# Patient Record
Sex: Female | Born: 1935 | Race: White | Hispanic: No | Smoking: Former smoker
Health system: Southern US, Community
[De-identification: ages and names within clinical notes are randomized; demographics above are authoritative.]

## PROBLEM LIST (undated history)

## (undated) DIAGNOSIS — F0280 Dementia in other diseases classified elsewhere without behavioral disturbance: Secondary | ICD-10-CM

## (undated) DIAGNOSIS — G309 Alzheimer's disease, unspecified: Secondary | ICD-10-CM

## (undated) DIAGNOSIS — I10 Essential (primary) hypertension: Secondary | ICD-10-CM

## (undated) DIAGNOSIS — F0390 Unspecified dementia without behavioral disturbance: Secondary | ICD-10-CM

## (undated) DIAGNOSIS — E785 Hyperlipidemia, unspecified: Secondary | ICD-10-CM

## (undated) DIAGNOSIS — C189 Malignant neoplasm of colon, unspecified: Secondary | ICD-10-CM

## (undated) DIAGNOSIS — E119 Type 2 diabetes mellitus without complications: Secondary | ICD-10-CM

## (undated) HISTORY — DX: Essential (primary) hypertension: I10

## (undated) HISTORY — DX: Hyperlipidemia, unspecified: E78.5

## (undated) HISTORY — PX: CHOLECYSTECTOMY: SHX55

## (undated) HISTORY — DX: Type 2 diabetes mellitus without complications: E11.9

---

## 1999-10-08 ENCOUNTER — Ambulatory Visit (HOSPITAL_COMMUNITY): Admission: RE | Admit: 1999-10-08 | Discharge: 1999-10-08 | Payer: Self-pay | Admitting: Internal Medicine

## 1999-10-09 ENCOUNTER — Encounter: Payer: Self-pay | Admitting: Internal Medicine

## 2003-12-03 ENCOUNTER — Other Ambulatory Visit: Admission: RE | Admit: 2003-12-03 | Discharge: 2003-12-03 | Payer: Self-pay | Admitting: Internal Medicine

## 2006-12-21 ENCOUNTER — Other Ambulatory Visit: Admission: RE | Admit: 2006-12-21 | Discharge: 2006-12-21 | Payer: Self-pay | Admitting: Internal Medicine

## 2007-04-14 ENCOUNTER — Encounter: Admission: RE | Admit: 2007-04-14 | Discharge: 2007-04-14 | Payer: Self-pay | Admitting: Internal Medicine

## 2007-12-28 ENCOUNTER — Encounter: Admission: RE | Admit: 2007-12-28 | Discharge: 2007-12-28 | Payer: Self-pay | Admitting: Internal Medicine

## 2008-12-31 ENCOUNTER — Ambulatory Visit: Payer: Self-pay | Admitting: Gastroenterology

## 2009-01-14 ENCOUNTER — Ambulatory Visit: Payer: Self-pay | Admitting: Gastroenterology

## 2009-01-14 ENCOUNTER — Encounter: Payer: Self-pay | Admitting: Gastroenterology

## 2009-01-17 ENCOUNTER — Encounter: Payer: Self-pay | Admitting: Gastroenterology

## 2013-11-10 ENCOUNTER — Encounter: Payer: Self-pay | Admitting: Gastroenterology

## 2014-06-13 ENCOUNTER — Encounter: Payer: Self-pay | Admitting: Gastroenterology

## 2015-01-01 ENCOUNTER — Ambulatory Visit (INDEPENDENT_AMBULATORY_CARE_PROVIDER_SITE_OTHER): Payer: BLUE CROSS/BLUE SHIELD | Admitting: Podiatry

## 2015-01-01 ENCOUNTER — Encounter: Payer: Self-pay | Admitting: Podiatry

## 2015-01-01 VITALS — BP 177/75 | HR 57 | Resp 12 | Ht 60.0 in | Wt 150.0 lb

## 2015-01-01 DIAGNOSIS — E119 Type 2 diabetes mellitus without complications: Secondary | ICD-10-CM

## 2015-01-01 DIAGNOSIS — M79676 Pain in unspecified toe(s): Secondary | ICD-10-CM

## 2015-01-01 DIAGNOSIS — B351 Tinea unguium: Secondary | ICD-10-CM

## 2015-01-01 NOTE — Patient Instructions (Signed)
Diabetes and Foot Care Diabetes may cause you to have problems because of poor blood supply (circulation) to your feet and legs. This may cause the skin on your feet to become thinner, break easier, and heal more slowly. Your skin may become dry, and the skin may peel and crack. You may also have nerve damage in your legs and feet causing decreased feeling in them. You may not notice minor injuries to your feet that could lead to infections or more serious problems. Taking care of your feet is one of the most important things you can do for yourself.  HOME CARE INSTRUCTIONS  Wear shoes at all times, even in the house. Do not go barefoot. Bare feet are easily injured.  Check your feet daily for blisters, cuts, and redness. If you cannot see the bottom of your feet, use a mirror or ask someone for help.  Wash your feet with warm water (do not use hot water) and mild soap. Then pat your feet and the areas between your toes until they are completely dry. Do not soak your feet as this can dry your skin.  Apply a moisturizing lotion or petroleum jelly (that does not contain alcohol and is unscented) to the skin on your feet and to dry, brittle toenails. Do not apply lotion between your toes.  Trim your toenails straight across. Do not dig under them or around the cuticle. File the edges of your nails with an emery board or nail file.  Do not cut corns or calluses or try to remove them with medicine.  Wear clean socks or stockings every day. Make sure they are not too tight. Do not wear knee-high stockings since they may decrease blood flow to your legs.  Wear shoes that fit properly and have enough cushioning. To break in new shoes, wear them for just a few hours a day. This prevents you from injuring your feet. Always look in your shoes before you put them on to be sure there are no objects inside.  Do not cross your legs. This may decrease the blood flow to your feet.  If you find a minor scrape,  cut, or break in the skin on your feet, keep it and the skin around it clean and dry. These areas may be cleansed with mild soap and water. Do not cleanse the area with peroxide, alcohol, or iodine.  When you remove an adhesive bandage, be sure not to damage the skin around it.  If you have a wound, look at it several times a day to make sure it is healing.  Do not use heating pads or hot water bottles. They may burn your skin. If you have lost feeling in your feet or legs, you may not know it is happening until it is too late.  Make sure your health care provider performs a complete foot exam at least annually or more often if you have foot problems. Report any cuts, sores, or bruises to your health care provider immediately. SEEK MEDICAL CARE IF:   You have an injury that is not healing.  You have cuts or breaks in the skin.  You have an ingrown nail.  You notice redness on your legs or feet.  You feel burning or tingling in your legs or feet.  You have pain or cramps in your legs and feet.  Your legs or feet are numb.  Your feet always feel cold. SEEK IMMEDIATE MEDICAL CARE IF:   There is increasing redness,   swelling, or pain in or around a wound.  There is a red line that goes up your leg.  Pus is coming from a wound.  You develop a fever or as directed by your health care provider.  You notice a bad smell coming from an ulcer or wound. Document Released: 11/12/2000 Document Revised: 07/18/2013 Document Reviewed: 04/24/2013 [employer] Patient Information 2015 [employer]. This information is not intended to replace advice given to you by your health care provider. Make sure you discuss any questions you have with your health care provider.  

## 2015-01-01 NOTE — Progress Notes (Signed)
   Subjective:    Patient ID: Yolanda Kennedy, female    DOB: Jul 27, 1928, 81 y.o.   MRN: [id number]  HPI Comments: N diabetic foot exam and debridement L 10 toenails D and O long-term C elongated thickened toenails A diabetic pt, difficulty cutting T hx of salon pedicure  She denies any history of foot ulceration or claudication   Review of Systems  Musculoskeletal: Positive for myalgias.  All other systems reviewed and are negative.      Objective:   Physical Exam   Orientated 3 patient presents with friend of the family  Vascular: DP pulses 2/4 bilaterally PT pulses 2/4 bilaterally Capillary reflex equal and reactive bilaterally  Neurological: Ankle reflex equal and reactive bilaterally Vibratory sensation intact bilaterally Sensation to 10 g monofilament wire intact 4/5 bilaterally  Dermatological: The toenails are extremely elongated, incurvated, discolored, hypertrophic and tender to palpation 6-10  Musculoskeletal HAV deformities bilaterally Hammertoe deformity second right Pes planus bilaterally There is no restriction ankle, subtalar, midtarsal joints bilaterally       Assessment & Plan:   Assessment: Satisfactory neurovascular status Diabetic without any complications Symptomatic onychomycoses 6-10  Plan: Debridement toenails 10 without a bleeding Discussed patient's findings of the diabetic foot examination today  Reappoint at three-month intervals for nail debridement

## 2015-01-26 ENCOUNTER — Emergency Department (HOSPITAL_COMMUNITY): Admission: EM | Admit: 2015-01-26 | Discharge: 2015-01-26 | Discharge status: Absent without leave | Payer: Self-pay

## 2015-01-26 ENCOUNTER — Emergency Department (HOSPITAL_COMMUNITY)
Admission: EM | Admit: 2015-01-26 | Discharge: 2015-01-26 | Disposition: A | Discharge status: Home / Self Care | Payer: Medicare Other | Attending: Emergency Medicine | Admitting: Emergency Medicine

## 2015-01-26 ENCOUNTER — Encounter (HOSPITAL_COMMUNITY): Payer: Self-pay | Admitting: Emergency Medicine

## 2015-01-26 DIAGNOSIS — M79609 Pain in unspecified limb: Secondary | ICD-10-CM | POA: Diagnosis not present

## 2015-01-26 DIAGNOSIS — I10 Essential (primary) hypertension: Secondary | ICD-10-CM | POA: Insufficient documentation

## 2015-01-26 DIAGNOSIS — E785 Hyperlipidemia, unspecified: Secondary | ICD-10-CM | POA: Diagnosis not present

## 2015-01-26 DIAGNOSIS — Z87891 Personal history of nicotine dependence: Secondary | ICD-10-CM | POA: Diagnosis not present

## 2015-01-26 DIAGNOSIS — M25562 Pain in left knee: Secondary | ICD-10-CM

## 2015-01-26 DIAGNOSIS — E119 Type 2 diabetes mellitus without complications: Secondary | ICD-10-CM | POA: Diagnosis not present

## 2015-01-26 DIAGNOSIS — Z79899 Other long term (current) drug therapy: Secondary | ICD-10-CM | POA: Diagnosis not present

## 2015-01-26 DIAGNOSIS — M79605 Pain in left leg: Secondary | ICD-10-CM | POA: Diagnosis present

## 2015-01-26 LAB — BASIC METABOLIC PANEL
ANION GAP: 9 (ref 5–15)
BUN: 24 mg/dL — ABNORMAL HIGH (ref 6–23)
CALCIUM: 9.2 mg/dL (ref 8.4–10.5)
CO2: 27 mmol/L (ref 19–32)
CREATININE: 1.18 mg/dL — AB (ref 0.50–1.10)
Chloride: 99 mmol/L (ref 96–112)
GFR calc non Af Amer: 41 mL/min — ABNORMAL LOW (ref >90)
GFR, EST AFRICAN AMERICAN: 48 mL/min — AB (ref >90)
Glucose, Bld: 149 mg/dL — ABNORMAL HIGH (ref 70–99)
POTASSIUM: 3.6 mmol/L (ref 3.5–5.1)
Sodium: 135 mmol/L (ref 135–145)

## 2015-01-26 LAB — CBC
HCT: 43.9 % (ref 36.0–46.0)
HEMOGLOBIN: 15.4 g/dL — AB (ref 12.0–15.0)
MCH: 31.8 pg (ref 26.0–34.0)
MCHC: 35.1 g/dL (ref 30.0–36.0)
MCV: 90.5 fL (ref 78.0–100.0)
Platelets: 150 10*3/uL (ref 150–400)
RBC: 4.85 MIL/uL (ref 3.87–5.11)
RDW: 13.3 % (ref 11.5–15.5)
WBC: 5 10*3/uL (ref 4.0–10.5)

## 2015-01-26 NOTE — ED Notes (Addendum)
Pt c/o pain to back of left leg onset Friday. Family concerned with possible DVT. Pt has history of baker's cyst. During triage pt c/o pain to left elbow area, hard knot noted to left elbow. Pt denies recent injury.

## 2015-01-26 NOTE — ED Provider Notes (Signed)
CSN: [id number]     Arrival date & time 01/26/15  1134 History   First MD Initiated Contact with Patient 01/26/15 1325     Chief Complaint  Patient presents with  . Leg Pain     (Consider location/radiation/quality/duration/timing/severity/associated sxs/prior Treatment) Patient is a 81 y.o. female presenting with leg pain. The history is provided by the patient and a relative.  Leg Pain Location:  Knee Injury: no   Knee location:  L knee Pain details:    Quality:  Aching and throbbing   Radiates to:  Does not radiate   Severity:  Moderate   Onset quality:  Gradual   Duration:  3 days   Timing:  Intermittent   Progression:  Waxing and waning Chronicity:  New Prior injury to area:  No (prior bakers cyst) Relieved by:  Rest Worsened by:  Bearing weight Ineffective treatments:  None tried Associated symptoms: no decreased ROM, no fever, no muscle weakness and no swelling   Risk factors: no frequent fractures     Past Medical History  Diagnosis Date  . Hyperlipidemia   . Hypertension   . Diabetes mellitus without complication    Past Surgical History  Procedure Laterality Date  . Cholecystectomy     No family history on file. History  Substance Use Topics  . Smoking status: Former Research scientist (life sciences)  . Smokeless tobacco: Not on file  . Alcohol Use: No   OB History    No data available     Review of Systems  Constitutional: Negative for fever.  All other systems reviewed and are negative.     Allergies  Review of patient's allergies indicates no known allergies.  Home Medications   Prior to Admission medications   Medication Sig Start Date End Date Taking? Authorizing Provider  atorvastatin (LIPITOR) 10 MG tablet Take 10 mg by mouth daily.    Historical Provider, MD  glimepiride (AMARYL) 4 MG tablet Take 4 mg by mouth daily with breakfast.    Historical Provider, MD  hydrochlorothiazide (HYDRODIURIL) 25 MG tablet Take 25 mg by mouth daily.    Historical Provider,  MD  lisinopril (PRINIVIL,ZESTRIL) 40 MG tablet Take 40 mg by mouth daily.    Historical Provider, MD  metFORMIN (GLUCOPHAGE) 500 MG tablet Take by mouth 2 (two) times daily with a meal.    Historical Provider, MD  metoprolol tartrate (LOPRESSOR) 25 MG tablet Take 25 mg by mouth daily.    Historical Provider, MD   BP 138/64 mmHg  Pulse 68  Temp(Src) 97.1 F (36.2 C)  Resp 18  Ht 5' (1.524 m)  Wt 152 lb 4 oz (69.06 kg)  BMI 29.73 kg/m2  SpO2 96% Physical Exam  Constitutional: She is oriented to person, place, and time. She appears well-developed and well-nourished. No distress.  HENT:  Head: Normocephalic and atraumatic.  Eyes: EOM are normal. Pupils are equal, round, and reactive to light.  Cardiovascular: Normal rate.   Pulmonary/Chest: Effort normal.  Musculoskeletal:       Left elbow: Normal.       Left knee: She exhibits normal range of motion, no swelling, no effusion, no deformity and no bony tenderness.       Legs: Neurological: She is alert and oriented to person, place, and time.  Skin: Skin is warm and dry. No rash noted. No erythema.  Psychiatric: She has a normal mood and affect. Her behavior is normal.  Nursing note and vitals reviewed.   ED Course  Procedures (including critical  care time) Labs Review Labs Reviewed  CBC - Abnormal; Notable for the following:    Hemoglobin 15.4 (*)    All other components within normal limits  BASIC METABOLIC PANEL - Abnormal; Notable for the following:    Glucose, Bld 149 (*)    BUN 24 (*)    Creatinine, Ser 1.18 (*)    GFR calc non Af Amer 41 (*)    GFR calc Af Amer 48 (*)    All other components within normal limits    Imaging Review No results found.   EKG Interpretation None      MDM   Final diagnoses:  Knee pain, acute, left    Patient presenting with worsening pain behind her left knee in the popliteal fossa with a prior Baker's cyst in the past. Worse with walking improved with rest denies lower  extremity erythema, swelling. On exam mild tenderness and fullness in the popliteal fossa on the left but no other acute findings. On my exam patient has no elbow injury or visible swelling.  Patient had labs done in triage which are within normal limits. Duplex of the left lower extremity pending.  3:15 PM  Doppler neg for clot or cyst.  Most likely pain is from arthritis.  Pt to use tylenol and ibuprofen.  Blanchie Dessert, MD 01/26/15 1517

## 2015-01-26 NOTE — ED Notes (Signed)
Pt with left posterior lateral knee pain. No visible swelling . Denies injury or abnormal activity.

## 2015-01-26 NOTE — Progress Notes (Signed)
VASCULAR LAB PRELIMINARY  PRELIMINARY  PRELIMINARY  PRELIMINARY  Left lower extremity venous Doppler completed.    Preliminary report:  There is no DVT, SVT, or Baker's cyst noted in the left lower extremity.   Shanikia Kernodle, RVT 01/26/2015, 2:30 PM

## 2015-03-17 ENCOUNTER — Ambulatory Visit (INDEPENDENT_AMBULATORY_CARE_PROVIDER_SITE_OTHER): Payer: Medicare Other | Admitting: Podiatry

## 2015-03-17 ENCOUNTER — Encounter: Payer: Self-pay | Admitting: Podiatry

## 2015-03-17 DIAGNOSIS — M79676 Pain in unspecified toe(s): Secondary | ICD-10-CM

## 2015-03-17 DIAGNOSIS — B351 Tinea unguium: Secondary | ICD-10-CM | POA: Diagnosis not present

## 2015-03-17 NOTE — Patient Instructions (Signed)
Diabetes and Foot Care Diabetes may cause you to have problems because of poor blood supply (circulation) to your feet and legs. This may cause the skin on your feet to become thinner, break easier, and heal more slowly. Your skin may become dry, and the skin may peel and crack. You may also have nerve damage in your legs and feet causing decreased feeling in them. You may not notice minor injuries to your feet that could lead to infections or more serious problems. Taking care of your feet is one of the most important things you can do for yourself.  HOME CARE INSTRUCTIONS  Wear shoes at all times, even in the house. Do not go barefoot. Bare feet are easily injured.  Check your feet daily for blisters, cuts, and redness. If you cannot see the bottom of your feet, use a mirror or ask someone for help.  Wash your feet with warm water (do not use hot water) and mild soap. Then pat your feet and the areas between your toes until they are completely dry. Do not soak your feet as this can dry your skin.  Apply a moisturizing lotion or petroleum jelly (that does not contain alcohol and is unscented) to the skin on your feet and to dry, brittle toenails. Do not apply lotion between your toes.  Trim your toenails straight across. Do not dig under them or around the cuticle. File the edges of your nails with an emery board or nail file.  Do not cut corns or calluses or try to remove them with medicine.  Wear clean socks or stockings every day. Make sure they are not too tight. Do not wear knee-high stockings since they may decrease blood flow to your legs.  Wear shoes that fit properly and have enough cushioning. To break in new shoes, wear them for just a few hours a day. This prevents you from injuring your feet. Always look in your shoes before you put them on to be sure there are no objects inside.  Do not cross your legs. This may decrease the blood flow to your feet.  If you find a minor scrape,  cut, or break in the skin on your feet, keep it and the skin around it clean and dry. These areas may be cleansed with mild soap and water. Do not cleanse the area with peroxide, alcohol, or iodine.  When you remove an adhesive bandage, be sure not to damage the skin around it.  If you have a wound, look at it several times a day to make sure it is healing.  Do not use heating pads or hot water bottles. They may burn your skin. If you have lost feeling in your feet or legs, you may not know it is happening until it is too late.  Make sure your health care provider performs a complete foot exam at least annually or more often if you have foot problems. Report any cuts, sores, or bruises to your health care provider immediately. SEEK MEDICAL CARE IF:   You have an injury that is not healing.  You have cuts or breaks in the skin.  You have an ingrown nail.  You notice redness on your legs or feet.  You feel burning or tingling in your legs or feet.  You have pain or cramps in your legs and feet.  Your legs or feet are numb.  Your feet always feel cold. SEEK IMMEDIATE MEDICAL CARE IF:   There is increasing redness,   swelling, or pain in or around a wound.  There is a red line that goes up your leg.  Pus is coming from a wound.  You develop a fever or as directed by your health care provider.  You notice a bad smell coming from an ulcer or wound. Document Released: 11/12/2000 Document Revised: 07/18/2013 Document Reviewed: 04/24/2013 [employer] Patient Information 2015 [employer]. This information is not intended to replace advice given to you by your health care provider. Make sure you discuss any questions you have with your health care provider.  

## 2015-03-18 NOTE — Progress Notes (Signed)
Patient ID: Yolanda Kennedy, female   DOB: [birth year], 81 y.o.   MRN: [id number]  Subjective: This patient presents again today complaining of painful toenails and requests toenail debridement  Objective: The toenails are elongated, brittle, discolored, incurvated and tender to direct palpation 6-10  Assessment: Diabetic Symptomatic onychomycoses 6-10  Plan: Debridement of toenails 10 without any bleeding  Reappoint 3 month

## 2015-04-02 ENCOUNTER — Ambulatory Visit: Payer: BLUE CROSS/BLUE SHIELD | Admitting: Podiatry

## 2015-05-26 ENCOUNTER — Ambulatory Visit: Payer: Medicare Other | Admitting: Podiatry

## 2015-06-03 ENCOUNTER — Encounter: Payer: Self-pay | Admitting: Podiatry

## 2015-06-03 ENCOUNTER — Ambulatory Visit (INDEPENDENT_AMBULATORY_CARE_PROVIDER_SITE_OTHER): Payer: Medicare Other | Admitting: Podiatry

## 2015-06-03 DIAGNOSIS — M79676 Pain in unspecified toe(s): Secondary | ICD-10-CM

## 2015-06-03 DIAGNOSIS — B351 Tinea unguium: Secondary | ICD-10-CM

## 2015-06-03 NOTE — Progress Notes (Signed)
Patient ID: Yolanda Kennedy, female   DOB: 1928-01-18, 81 y.o.   MRN: [id number]  Subjective: This patient presents for scheduled visit complaining of painful toenails and requests toenail debridement  Objective: The toenails are brittle, elongated, discolored, incurvated and tender to direct palpation 6-10  Assessment: Symptomatic onychomycoses 6-10 Diabetic   Plan: Debridement of toenails 10 mechanically electrically without any bleeding  Reappoint 3 months

## 2015-06-03 NOTE — Patient Instructions (Signed)
Diabetes and Foot Care Diabetes may cause you to have problems because of poor blood supply (circulation) to your feet and legs. This may cause the skin on your feet to become thinner, break easier, and heal more slowly. Your skin may become dry, and the skin may peel and crack. You may also have nerve damage in your legs and feet causing decreased feeling in them. You may not notice minor injuries to your feet that could lead to infections or more serious problems. Taking care of your feet is one of the most important things you can do for yourself.  HOME CARE INSTRUCTIONS  Wear shoes at all times, even in the house. Do not go barefoot. Bare feet are easily injured.  Check your feet daily for blisters, cuts, and redness. If you cannot see the bottom of your feet, use a mirror or ask someone for help.  Wash your feet with warm water (do not use hot water) and mild soap. Then pat your feet and the areas between your toes until they are completely dry. Do not soak your feet as this can dry your skin.  Apply a moisturizing lotion or petroleum jelly (that does not contain alcohol and is unscented) to the skin on your feet and to dry, brittle toenails. Do not apply lotion between your toes.  Trim your toenails straight across. Do not dig under them or around the cuticle. File the edges of your nails with an emery board or nail file.  Do not cut corns or calluses or try to remove them with medicine.  Wear clean socks or stockings every day. Make sure they are not too tight. Do not wear knee-high stockings since they may decrease blood flow to your legs.  Wear shoes that fit properly and have enough cushioning. To break in new shoes, wear them for just a few hours a day. This prevents you from injuring your feet. Always look in your shoes before you put them on to be sure there are no objects inside.  Do not cross your legs. This may decrease the blood flow to your feet.  If you find a minor scrape,  cut, or break in the skin on your feet, keep it and the skin around it clean and dry. These areas may be cleansed with mild soap and water. Do not cleanse the area with peroxide, alcohol, or iodine.  When you remove an adhesive bandage, be sure not to damage the skin around it.  If you have a wound, look at it several times a day to make sure it is healing.  Do not use heating pads or hot water bottles. They may burn your skin. If you have lost feeling in your feet or legs, you may not know it is happening until it is too late.  Make sure your health care provider performs a complete foot exam at least annually or more often if you have foot problems. Report any cuts, sores, or bruises to your health care provider immediately. SEEK MEDICAL CARE IF:   You have an injury that is not healing.  You have cuts or breaks in the skin.  You have an ingrown nail.  You notice redness on your legs or feet.  You feel burning or tingling in your legs or feet.  You have pain or cramps in your legs and feet.  Your legs or feet are numb.  Your feet always feel cold. SEEK IMMEDIATE MEDICAL CARE IF:   There is increasing redness,   swelling, or pain in or around a wound.  There is a red line that goes up your leg.  Pus is coming from a wound.  You develop a fever or as directed by your health care provider.  You notice a bad smell coming from an ulcer or wound. Document Released: 11/12/2000 Document Revised: 07/18/2013 Document Reviewed: 04/24/2013 [employer] Patient Information 2015 [employer]. This information is not intended to replace advice given to you by your health care provider. Make sure you discuss any questions you have with your health care provider.  

## 2015-09-02 ENCOUNTER — Ambulatory Visit: Payer: Medicare Other | Admitting: Podiatry

## 2015-09-03 ENCOUNTER — Encounter: Payer: Self-pay | Admitting: Podiatry

## 2015-09-03 ENCOUNTER — Ambulatory Visit (INDEPENDENT_AMBULATORY_CARE_PROVIDER_SITE_OTHER): Payer: Medicare Other | Admitting: Podiatry

## 2015-09-03 DIAGNOSIS — B351 Tinea unguium: Secondary | ICD-10-CM

## 2015-09-03 DIAGNOSIS — M79676 Pain in unspecified toe(s): Secondary | ICD-10-CM

## 2015-09-03 NOTE — Patient Instructions (Signed)
Apply topical antibiotic ointment daily between first and second toes on the right foot and first and second toes on the left foot and second and third toes on the right foot until the skin appears normal These areas or fissuresDiabetes and Foot Care Diabetes may cause you to have problems because of poor blood supply (circulation) to your feet and legs. This may cause the skin on your feet to become thinner, break easier, and heal more slowly. Your skin may become dry, and the skin may peel and crack. You may also have nerve damage in your legs and feet causing decreased feeling in them. You may not notice minor injuries to your feet that could lead to infections or more serious problems. Taking care of your feet is one of the most important things you can do for yourself.  HOME CARE INSTRUCTIONS  Wear shoes at all times, even in the house. Do not go barefoot. Bare feet are easily injured.  Check your feet daily for blisters, cuts, and redness. If you cannot see the bottom of your feet, use a mirror or ask someone for help.  Wash your feet with warm water (do not use hot water) and mild soap. Then pat your feet and the areas between your toes until they are completely dry. Do not soak your feet as this can dry your skin.  Apply a moisturizing lotion or petroleum jelly (that does not contain alcohol and is unscented) to the skin on your feet and to dry, brittle toenails. Do not apply lotion between your toes.  Trim your toenails straight across. Do not dig under them or around the cuticle. File the edges of your nails with an emery board or nail file.  Do not cut corns or calluses or try to remove them with medicine.  Wear clean socks or stockings every day. Make sure they are not too tight. Do not wear knee-high stockings since they may decrease blood flow to your legs.  Wear shoes that fit properly and have enough cushioning. To break in new shoes, wear them for just a few hours a day. This  prevents you from injuring your feet. Always look in your shoes before you put them on to be sure there are no objects inside.  Do not cross your legs. This may decrease the blood flow to your feet.  If you find a minor scrape, cut, or break in the skin on your feet, keep it and the skin around it clean and dry. These areas may be cleansed with mild soap and water. Do not cleanse the area with peroxide, alcohol, or iodine.  When you remove an adhesive bandage, be sure not to damage the skin around it.  If you have a wound, look at it several times a day to make sure it is healing.  Do not use heating pads or hot water bottles. They may burn your skin. If you have lost feeling in your feet or legs, you may not know it is happening until it is too late.  Make sure your health care provider performs a complete foot exam at least annually or more often if you have foot problems. Report any cuts, sores, or bruises to your health care provider immediately. SEEK MEDICAL CARE IF:   You have an injury that is not healing.  You have cuts or breaks in the skin.  You have an ingrown nail.  You notice redness on your legs or feet.  You feel burning or tingling  in your legs or feet.  You have pain or cramps in your legs and feet.  Your legs or feet are numb.  Your feet always feel cold. SEEK IMMEDIATE MEDICAL CARE IF:   There is increasing redness, swelling, or pain in or around a wound.  There is a red line that goes up your leg.  Pus is coming from a wound.  You develop a fever or as directed by your health care provider.  You notice a bad smell coming from an ulcer or wound.   This information is not intended to replace advice given to you by your health care provider. Make sure you discuss any questions you have with your health care provider.   Document Released: 11/12/2000 Document Revised: 07/18/2013 Document Reviewed: 04/24/2013 Elsevier Interactive Patient Education [employer]

## 2015-09-04 NOTE — Progress Notes (Signed)
Patient ID: Yolanda Kennedy, female   DOB: 1928-10-26, 81 y.o.   MRN: [id number]  Subjective: This patient presents today for schedule visit complaining of painful toenails and requests toenail debridement.  Objective: Superficial noninfected fissures first and second toe webs right and first left there is no surrounding erythema, edema or drainage noted in any of these sites The toenails are discolored, elongated, incurvated, hypertrophic and tender to direct palpation 6-10  Assessment: Symptomatic onychomycoses 6-10 Noninfected fissures first and second toe webs right and first left Diabetic  Plan: Debridement toenails 10 mechanically of left without any bleeding Advised patient apply triple plantar biotic ointment to the first and second toe webs right and first left daily until healed  Reappoint 3 months

## 2015-12-10 ENCOUNTER — Ambulatory Visit: Payer: Medicare Other | Admitting: Podiatry

## 2015-12-31 ENCOUNTER — Ambulatory Visit (INDEPENDENT_AMBULATORY_CARE_PROVIDER_SITE_OTHER): Payer: Medicare Other | Admitting: Podiatry

## 2015-12-31 ENCOUNTER — Encounter: Payer: Self-pay | Admitting: Podiatry

## 2015-12-31 DIAGNOSIS — M2010 Hallux valgus (acquired), unspecified foot: Secondary | ICD-10-CM

## 2015-12-31 DIAGNOSIS — B351 Tinea unguium: Secondary | ICD-10-CM

## 2015-12-31 DIAGNOSIS — M2040 Other hammer toe(s) (acquired), unspecified foot: Secondary | ICD-10-CM

## 2015-12-31 DIAGNOSIS — M79676 Pain in unspecified toe(s): Secondary | ICD-10-CM

## 2015-12-31 NOTE — Patient Instructions (Signed)
Diabetes and Foot Care Diabetes may cause you to have problems because of poor blood supply (circulation) to your feet and legs. This may cause the skin on your feet to become thinner, break easier, and heal more slowly. Your skin may become dry, and the skin may peel and crack. You may also have nerve damage in your legs and feet causing decreased feeling in them. You may not notice minor injuries to your feet that could lead to infections or more serious problems. Taking care of your feet is one of the most important things you can do for yourself.  HOME CARE INSTRUCTIONS  Wear shoes at all times, even in the house. Do not go barefoot. Bare feet are easily injured.  Check your feet daily for blisters, cuts, and redness. If you cannot see the bottom of your feet, use a mirror or ask someone for help.  Wash your feet with warm water (do not use hot water) and mild soap. Then pat your feet and the areas between your toes until they are completely dry. Do not soak your feet as this can dry your skin.  Apply a moisturizing lotion or petroleum jelly (that does not contain alcohol and is unscented) to the skin on your feet and to dry, brittle toenails. Do not apply lotion between your toes.  Trim your toenails straight across. Do not dig under them or around the cuticle. File the edges of your nails with an emery board or nail file.  Do not cut corns or calluses or try to remove them with medicine.  Wear clean socks or stockings every day. Make sure they are not too tight. Do not wear knee-high stockings since they may decrease blood flow to your legs.  Wear shoes that fit properly and have enough cushioning. To break in new shoes, wear them for just a few hours a day. This prevents you from injuring your feet. Always look in your shoes before you put them on to be sure there are no objects inside.  Do not cross your legs. This may decrease the blood flow to your feet.  If you find a minor scrape,  cut, or break in the skin on your feet, keep it and the skin around it clean and dry. These areas may be cleansed with mild soap and water. Do not cleanse the area with peroxide, alcohol, or iodine.  When you remove an adhesive bandage, be sure not to damage the skin around it.  If you have a wound, look at it several times a day to make sure it is healing.  Do not use heating pads or hot water bottles. They may burn your skin. If you have lost feeling in your feet or legs, you may not know it is happening until it is too late.  Make sure your health care provider performs a complete foot exam at least annually or more often if you have foot problems. Report any cuts, sores, or bruises to your health care provider immediately. SEEK MEDICAL CARE IF:   You have an injury that is not healing.  You have cuts or breaks in the skin.  You have an ingrown nail.  You notice redness on your legs or feet.  You feel burning or tingling in your legs or feet.  You have pain or cramps in your legs and feet.  Your legs or feet are numb.  Your feet always feel cold. SEEK IMMEDIATE MEDICAL CARE IF:   There is increasing redness,   swelling, or pain in or around a wound.  There is a red line that goes up your leg.  Pus is coming from a wound.  You develop a fever or as directed by your health care provider.  You notice a bad smell coming from an ulcer or wound.   This information is not intended to replace advice given to you by your health care provider. Make sure you discuss any questions you have with your health care provider.   Document Released: 11/12/2000 Document Revised: 07/18/2013 Document Reviewed: 04/24/2013 Elsevier Interactive Patient Education 2016 [employer]  

## 2016-01-01 NOTE — Progress Notes (Signed)
Patient ID: Yolanda Kennedy, female   DOB: [birth year], 82 y.o.   MRN: [id number]  Subjective: This patient presents today for scheduled visit complaining of elongated and thickened toenails and requests toenail debridement. Also, patient's long time family friend who she calls her" daughter in law"is present in the treatment room. Her friend is requesting diabetic shoes because patient has swelling and having difficulty with shoe fat  Objective: Patient denies history of claudication amputation or skin ulceration  Vascular: Pitting edema bilaterally DP 2/4 bilaterally PT pulses 1/4 bilaterally Capillary reflex immediate bilaterally  Neurological: Sensation to 10 g monofilament wire intact 4/5 bilaterally Vibratory sensation reactive bilaterally Ankle reflex equal and reactive bilaterally  Dermatological: The toenails are hypertrophic, elongated, brittle, deformed and tender direct palpation 6-10  Musculoskeletal: HAV deformities bilaterally Hammertoe second bilaterally  Assessment: Symptomatic onychomycoses 6-10 Peripheral edema bilaterally Decreased posterior tibial pulses, bilaterally Decreased sensation to 10 g monofilament wire, bilaterally HAV deformities bilaterally Hammertoe second bilaterally  Plan: Debridement toenails 6-10 mechanically an electrical without a bleeding  Obtain certification for diabetic shoes Diabetic mononeuropathy (decrease sensation to 10 g monofilament wire 4/5 bilaterally HAV hammertoe's, bilaterally Diminished posterior tibial pulses, bilaterally

## 2016-02-05 DIAGNOSIS — R21 Rash and other nonspecific skin eruption: Secondary | ICD-10-CM | POA: Diagnosis not present

## 2016-02-05 DIAGNOSIS — E1165 Type 2 diabetes mellitus with hyperglycemia: Secondary | ICD-10-CM | POA: Diagnosis not present

## 2016-02-05 DIAGNOSIS — F0390 Unspecified dementia without behavioral disturbance: Secondary | ICD-10-CM | POA: Diagnosis not present

## 2016-02-05 DIAGNOSIS — Z0000 Encounter for general adult medical examination without abnormal findings: Secondary | ICD-10-CM | POA: Diagnosis not present

## 2016-02-25 ENCOUNTER — Ambulatory Visit: Payer: Medicare Other | Admitting: *Deleted

## 2016-02-25 DIAGNOSIS — E119 Type 2 diabetes mellitus without complications: Secondary | ICD-10-CM

## 2016-02-25 NOTE — Progress Notes (Signed)
Patient ID: Yolanda Kennedy, female   DOB: January 12, 1928, 82 y.o.   MRN: [id number] Patient presents to be scanned and measured for diabetic shoes and inserts.

## 2016-04-14 ENCOUNTER — Encounter: Payer: Self-pay | Admitting: Podiatry

## 2016-04-14 ENCOUNTER — Ambulatory Visit (INDEPENDENT_AMBULATORY_CARE_PROVIDER_SITE_OTHER): Payer: Medicare Other | Admitting: Podiatry

## 2016-04-14 DIAGNOSIS — M2040 Other hammer toe(s) (acquired), unspecified foot: Secondary | ICD-10-CM

## 2016-04-14 DIAGNOSIS — M2010 Hallux valgus (acquired), unspecified foot: Secondary | ICD-10-CM

## 2016-04-14 DIAGNOSIS — M79676 Pain in unspecified toe(s): Secondary | ICD-10-CM

## 2016-04-14 DIAGNOSIS — R0989 Other specified symptoms and signs involving the circulatory and respiratory systems: Secondary | ICD-10-CM | POA: Diagnosis not present

## 2016-04-14 DIAGNOSIS — E0841 Diabetes mellitus due to underlying condition with diabetic mononeuropathy: Secondary | ICD-10-CM | POA: Diagnosis not present

## 2016-04-14 DIAGNOSIS — B351 Tinea unguium: Secondary | ICD-10-CM | POA: Diagnosis not present

## 2016-04-14 NOTE — Progress Notes (Signed)
Patient ID: Yolanda Kennedy, female   DOB: 1928/04/27, 82 y.o.   MRN: [id number]    Subjective: This patient presents today for scheduled visit complaining of elongated and thickened toenails and requests toenail debridement. Also, patient presents for dispensing diabetic shoes and custom insoles   Objective: Patient denies history of claudication amputation or skin ulceration  Vascular: Pitting edema bilaterally DP 2/4 bilaterally PT pulses 1/4 bilaterally Capillary reflex immediate bilaterally  Neurological: Sensation to 10 g monofilament wire intact 4/5 bilaterally Vibratory sensation reactive bilaterally Ankle reflex equal and reactive bilaterally  Dermatological:  The toenails are hypertrophic, elongated, brittle, deformed and tender direct palpation 6-10  Musculoskeletal: HAV deformities bilaterally Hammertoe second bilaterally  Assessment: Symptomatic onychomycoses 6-10 Peripheral edema bilaterally Decreased posterior tibial pulses, bilaterally Decreased sensation to 10 g monofilament wire, bilaterally HAV deformities bilaterally Hammertoe second bilaterally Satisfactory fit of diabetic shoes 2 and custom insoles 6  Plan: Debridement toenails 6-10 mechanically an electrical without a bleeding  Diabetic mononeuropathy (decrease sensation to 10 g monofilament wire 4/5 bilaterally HAV hammertoe's, bilaterally Diminished posterior tibial pulses, bilaterally   Patient received 1 Pair Apex A832 Linda Taupe in women's size 7 wide and 3 pairs custom molded diabetic inserts.  Verbal and written break in and wear instructions givPatient presents for diabetic shoe pick up, shoes are tried on for good fit.

## 2016-04-14 NOTE — Patient Instructions (Signed)

## 2016-05-04 ENCOUNTER — Other Ambulatory Visit: Payer: Self-pay | Admitting: Internal Medicine

## 2016-05-04 ENCOUNTER — Ambulatory Visit
Admission: RE | Admit: 2016-05-04 | Discharge: 2016-05-04 | Disposition: A | Discharge status: Home / Self Care | Payer: Medicare Other | Source: Ambulatory Visit | Attending: Internal Medicine | Admitting: Internal Medicine

## 2016-05-04 DIAGNOSIS — W19XXXA Unspecified fall, initial encounter: Secondary | ICD-10-CM

## 2016-05-04 DIAGNOSIS — E119 Type 2 diabetes mellitus without complications: Secondary | ICD-10-CM | POA: Diagnosis not present

## 2016-05-04 DIAGNOSIS — M25512 Pain in left shoulder: Secondary | ICD-10-CM

## 2016-06-15 ENCOUNTER — Emergency Department (HOSPITAL_COMMUNITY): Payer: Medicare Other

## 2016-06-15 ENCOUNTER — Inpatient Hospital Stay (HOSPITAL_COMMUNITY)
Admission: EM | Admit: 2016-06-15 | Discharge: 2016-06-18 | DRG: 689 | Disposition: A | Discharge status: Home Health | Payer: Medicare Other | Attending: Family Medicine | Admitting: Family Medicine

## 2016-06-15 ENCOUNTER — Encounter (HOSPITAL_COMMUNITY): Payer: Self-pay | Admitting: Emergency Medicine

## 2016-06-15 DIAGNOSIS — Z7984 Long term (current) use of oral hypoglycemic drugs: Secondary | ICD-10-CM | POA: Diagnosis not present

## 2016-06-15 DIAGNOSIS — F0390 Unspecified dementia without behavioral disturbance: Secondary | ICD-10-CM | POA: Diagnosis not present

## 2016-06-15 DIAGNOSIS — F0391 Unspecified dementia with behavioral disturbance: Secondary | ICD-10-CM

## 2016-06-15 DIAGNOSIS — M25512 Pain in left shoulder: Secondary | ICD-10-CM | POA: Diagnosis not present

## 2016-06-15 DIAGNOSIS — G9340 Encephalopathy, unspecified: Secondary | ICD-10-CM | POA: Diagnosis present

## 2016-06-15 DIAGNOSIS — S0081XA Abrasion of other part of head, initial encounter: Secondary | ICD-10-CM | POA: Diagnosis not present

## 2016-06-15 DIAGNOSIS — S0083XA Contusion of other part of head, initial encounter: Secondary | ICD-10-CM | POA: Diagnosis not present

## 2016-06-15 DIAGNOSIS — E118 Type 2 diabetes mellitus with unspecified complications: Secondary | ICD-10-CM

## 2016-06-15 DIAGNOSIS — E119 Type 2 diabetes mellitus without complications: Secondary | ICD-10-CM | POA: Diagnosis not present

## 2016-06-15 DIAGNOSIS — Z87891 Personal history of nicotine dependence: Secondary | ICD-10-CM

## 2016-06-15 DIAGNOSIS — M79622 Pain in left upper arm: Secondary | ICD-10-CM | POA: Diagnosis not present

## 2016-06-15 DIAGNOSIS — Z79899 Other long term (current) drug therapy: Secondary | ICD-10-CM

## 2016-06-15 DIAGNOSIS — W1830XA Fall on same level, unspecified, initial encounter: Secondary | ICD-10-CM | POA: Diagnosis present

## 2016-06-15 DIAGNOSIS — S199XXA Unspecified injury of neck, initial encounter: Secondary | ICD-10-CM | POA: Diagnosis not present

## 2016-06-15 DIAGNOSIS — I10 Essential (primary) hypertension: Secondary | ICD-10-CM

## 2016-06-15 DIAGNOSIS — N390 Urinary tract infection, site not specified: Secondary | ICD-10-CM | POA: Diagnosis not present

## 2016-06-15 DIAGNOSIS — W19XXXA Unspecified fall, initial encounter: Secondary | ICD-10-CM

## 2016-06-15 DIAGNOSIS — M79621 Pain in right upper arm: Secondary | ICD-10-CM | POA: Diagnosis not present

## 2016-06-15 DIAGNOSIS — R911 Solitary pulmonary nodule: Secondary | ICD-10-CM | POA: Diagnosis not present

## 2016-06-15 DIAGNOSIS — Y92410 Unspecified street and highway as the place of occurrence of the external cause: Secondary | ICD-10-CM

## 2016-06-15 DIAGNOSIS — R402142 Coma scale, eyes open, spontaneous, at arrival to emergency department: Secondary | ICD-10-CM | POA: Diagnosis present

## 2016-06-15 DIAGNOSIS — R402242 Coma scale, best verbal response, confused conversation, at arrival to emergency department: Secondary | ICD-10-CM | POA: Diagnosis present

## 2016-06-15 DIAGNOSIS — R402362 Coma scale, best motor response, obeys commands, at arrival to emergency department: Secondary | ICD-10-CM | POA: Diagnosis present

## 2016-06-15 DIAGNOSIS — T148 Other injury of unspecified body region: Secondary | ICD-10-CM | POA: Diagnosis not present

## 2016-06-15 DIAGNOSIS — S0993XA Unspecified injury of face, initial encounter: Secondary | ICD-10-CM | POA: Diagnosis not present

## 2016-06-15 DIAGNOSIS — S0990XA Unspecified injury of head, initial encounter: Secondary | ICD-10-CM | POA: Diagnosis not present

## 2016-06-15 HISTORY — DX: Unspecified dementia, unspecified severity, without behavioral disturbance, psychotic disturbance, mood disturbance, and anxiety: F03.90

## 2016-06-15 LAB — CBC
HCT: 42.1 % (ref 36.0–46.0)
HEMOGLOBIN: 14.4 g/dL (ref 12.0–15.0)
MCH: 31.8 pg (ref 26.0–34.0)
MCHC: 34.2 g/dL (ref 30.0–36.0)
MCV: 92.9 fL (ref 78.0–100.0)
Platelets: 171 10*3/uL (ref 150–400)
RBC: 4.53 MIL/uL (ref 3.87–5.11)
RDW: 13.4 % (ref 11.5–15.5)
WBC: 11.5 10*3/uL — ABNORMAL HIGH (ref 4.0–10.5)

## 2016-06-15 LAB — GLUCOSE, CAPILLARY
Glucose-Capillary: 121 mg/dL — ABNORMAL HIGH (ref 65–99)
Glucose-Capillary: 127 mg/dL — ABNORMAL HIGH (ref 65–99)

## 2016-06-15 LAB — URINALYSIS, ROUTINE W REFLEX MICROSCOPIC
BILIRUBIN URINE: NEGATIVE
GLUCOSE, UA: NEGATIVE mg/dL
Ketones, ur: 15 mg/dL — AB
Nitrite: POSITIVE — AB
PH: 5.5 (ref 5.0–8.0)
Protein, ur: NEGATIVE mg/dL
SPECIFIC GRAVITY, URINE: 1.016 (ref 1.005–1.030)

## 2016-06-15 LAB — BASIC METABOLIC PANEL
Anion gap: 14 (ref 5–15)
BUN: 20 mg/dL (ref 6–20)
CHLORIDE: 103 mmol/L (ref 101–111)
CO2: 19 mmol/L — ABNORMAL LOW (ref 22–32)
CREATININE: 1.09 mg/dL — AB (ref 0.44–1.00)
Calcium: 10.1 mg/dL (ref 8.9–10.3)
GFR calc Af Amer: 52 mL/min — ABNORMAL LOW (ref >60)
GFR calc non Af Amer: 45 mL/min — ABNORMAL LOW (ref >60)
GLUCOSE: 184 mg/dL — AB (ref 65–99)
Potassium: 3.8 mmol/L (ref 3.5–5.1)
SODIUM: 136 mmol/L (ref 135–145)

## 2016-06-15 LAB — URINE MICROSCOPIC-ADD ON

## 2016-06-15 MED ORDER — HYDROCHLOROTHIAZIDE 25 MG PO TABS
25.0000 mg | ORAL_TABLET | Freq: Every day | ORAL | Status: DC
  Administered 2016-06-15 – 2016-06-18 (×4): 25 mg via ORAL
  Filled 2016-06-15 (×4): qty 1

## 2016-06-15 MED ORDER — ACETAMINOPHEN 650 MG RE SUPP: 650.0000 mg | Freq: Four times a day (QID) | RECTAL | Status: DC | PRN

## 2016-06-15 MED ORDER — LISINOPRIL 20 MG PO TABS
40.0000 mg | ORAL_TABLET | Freq: Every day | ORAL | Status: DC
Start: 2016-06-15 — End: 2016-06-18
  Administered 2016-06-15 – 2016-06-18 (×4): 40 mg via ORAL
  Filled 2016-06-15 (×4): qty 2

## 2016-06-15 MED ORDER — INSULIN ASPART 100 UNIT/ML ~~LOC~~ SOLN
0.0000 [IU] | Freq: Three times a day (TID) | SUBCUTANEOUS | Status: DC
  Administered 2016-06-15 (×2): 1 [IU] via SUBCUTANEOUS
  Administered 2016-06-16: 2 [IU] via SUBCUTANEOUS
  Administered 2016-06-16 (×2): 1 [IU] via SUBCUTANEOUS
  Administered 2016-06-17: 3 [IU] via SUBCUTANEOUS
  Administered 2016-06-17: 1 [IU] via SUBCUTANEOUS
  Administered 2016-06-17 – 2016-06-18 (×3): 2 [IU] via SUBCUTANEOUS

## 2016-06-15 MED ORDER — SODIUM CHLORIDE 0.9 % IV BOLUS (SEPSIS)
500.0000 mL | Freq: Once | INTRAVENOUS | Status: AC
  Administered 2016-06-15: 500 mL via INTRAVENOUS

## 2016-06-15 MED ORDER — TRAZODONE HCL 100 MG PO TABS
100.0000 mg | ORAL_TABLET | Freq: Every day | ORAL | Status: DC
  Administered 2016-06-15 – 2016-06-17 (×3): 100 mg via ORAL
  Filled 2016-06-15 (×4): qty 1

## 2016-06-15 MED ORDER — MUPIROCIN 2 % EX OINT
1.0000 "application " | TOPICAL_OINTMENT | Freq: Two times a day (BID) | CUTANEOUS | Status: AC
  Administered 2016-06-15 – 2016-06-18 (×6): 1 via TOPICAL
  Filled 2016-06-15 (×2): qty 22

## 2016-06-15 MED ORDER — DEXTROSE 5 % IV SOLN
1.0000 g | INTRAVENOUS | Status: DC
  Administered 2016-06-15 – 2016-06-17 (×3): 1 g via INTRAVENOUS
  Filled 2016-06-15 (×3): qty 10

## 2016-06-15 MED ORDER — INSULIN ASPART 100 UNIT/ML ~~LOC~~ SOLN: 0.0000 [IU] | Freq: Every day | SUBCUTANEOUS | Status: DC

## 2016-06-15 MED ORDER — ONDANSETRON HCL 4 MG PO TABS: 4.0000 mg | ORAL_TABLET | Freq: Four times a day (QID) | ORAL | Status: DC | PRN

## 2016-06-15 MED ORDER — ONDANSETRON HCL 4 MG/2ML IJ SOLN: 4.0000 mg | Freq: Four times a day (QID) | INTRAMUSCULAR | Status: DC | PRN

## 2016-06-15 MED ORDER — ACETAMINOPHEN 500 MG PO TABS
1000.0000 mg | ORAL_TABLET | Freq: Once | ORAL | Status: AC
  Administered 2016-06-15: 1000 mg via ORAL
  Filled 2016-06-15: qty 2

## 2016-06-15 MED ORDER — ACETAMINOPHEN 325 MG PO TABS: 650.0000 mg | ORAL_TABLET | Freq: Four times a day (QID) | ORAL | Status: DC | PRN

## 2016-06-15 MED ORDER — HALOPERIDOL 0.5 MG PO TABS
0.2500 mg | ORAL_TABLET | Freq: Every day | ORAL | Status: DC
  Administered 2016-06-15 – 2016-06-18 (×4): 0.25 mg via ORAL
  Filled 2016-06-15 (×4): qty 1

## 2016-06-15 MED ORDER — LORAZEPAM 0.5 MG PO TABS: 0.2500 mg | ORAL_TABLET | Freq: Three times a day (TID) | ORAL | Status: DC | PRN

## 2016-06-15 MED ORDER — METOPROLOL TARTRATE 25 MG PO TABS
25.0000 mg | ORAL_TABLET | Freq: Every day | ORAL | Status: DC
  Administered 2016-06-15 – 2016-06-18 (×4): 25 mg via ORAL
  Filled 2016-06-15 (×4): qty 1

## 2016-06-15 MED ORDER — SODIUM CHLORIDE 0.45 % IV SOLN
INTRAVENOUS | Status: DC
  Administered 2016-06-15 – 2016-06-16 (×2): via INTRAVENOUS

## 2016-06-15 NOTE — Progress Notes (Signed)
TELE applied and confirmed. SCD applied. Orthostatic VS completed.  Ave Filter, RN

## 2016-06-15 NOTE — H&P (Addendum)
History and Physical    Yolanda Kennedy [id number] DOB: 04/19/1928 DOA: 06/15/2016  PCP: Criselda Peaches, MD Patient coming from: Home  Chief Complaint: confusion and fall.   HPI: Yolanda Kennedy is a 82 y.o. female with medical history significant of HTN, DM, dementia, presenting after patient was found down on the curb confused. Level V caveat applies as patient is demented and unable to provide reliable history. History provided in part by EDP, EMS report, and patient's best friend who is very good knowledge base with regards to patient's daily functioning. Patient lives at home by herself and has an in-home health aide throughout the day who helps with bathing cleaning and cooking. Patient is able to reasonably participate in conversation at baseline but quickly forgets the conversation after having it. Currently patient's mental status is at baseline per patient's friend. At time of event earlier today patient was confused and thought that she was at home and unable to provide any history. Patient is without any focal complaints including chest pain, shortness breath, palpitations, neck stiffness, abdominal pain, diarrhea, dysuria, frequency, back. Patient does have a right facial abrasion which is tender.  ED Course: Objective findings below. Numerous imaging studies performed without evidence of fracture or intracranial hemorrhaging  Review of Systems: As per HPI otherwise 10 point review of systems negative.   Ambulatory Status: no restrictions  Past Medical History  Diagnosis Date  . Hyperlipidemia   . Hypertension   . Diabetes mellitus without complication ([place])   . Dementia     Past Surgical History  Procedure Laterality Date  . Cholecystectomy      Social History   Social History  . Marital Status: Widowed    Spouse Name: N/A  . Number of Children: N/A  . Years of Education: N/A   Occupational History  . Not on file.   Social History Main Topics  . Smoking  status: Former Research scientist (life sciences)  . Smokeless tobacco: Not on file  . Alcohol Use: No  . Drug Use: No  . Sexual Activity: Not on file   Other Topics Concern  . Not on file   Social History Narrative    No Known Allergies  Family History  Problem Relation Age of Onset  . Family history unknown: Yes    Prior to Admission medications   Medication Sig Start Date End Date Taking? Authorizing Provider  cetirizine (ZYRTEC) 10 MG tablet Take 10 mg by mouth daily.   Yes Historical Provider, MD  glimepiride (AMARYL) 4 MG tablet Take 4 mg by mouth daily with breakfast.   Yes Historical Provider, MD  haloperidol (HALDOL) 0.5 MG tablet Take 0.25 mg by mouth daily.  08/31/15  Yes Historical Provider, MD  hydrochlorothiazide (HYDRODIURIL) 25 MG tablet Take 25 mg by mouth daily.   Yes Historical Provider, MD  lisinopril (PRINIVIL,ZESTRIL) 40 MG tablet Take 40 mg by mouth daily.   Yes Historical Provider, MD  LORazepam (ATIVAN) 0.5 MG tablet Take 0.25 mg by mouth every 8 (eight) hours as needed for anxiety.  10/22/15  Yes Historical Provider, MD  metFORMIN (GLUCOPHAGE) 500 MG tablet Take 500-1,000 mg by mouth 2 (two) times daily with a meal. 1000mg  in the morning, 500mg  in the evening   Yes Historical Provider, MD  metoprolol tartrate (LOPRESSOR) 25 MG tablet Take 25 mg by mouth daily.   Yes Historical Provider, MD  Multiple Vitamin (HEALTHY HAIR/SKIN/NAILS) TABS Take 1 tablet by mouth daily.    Yes Historical Provider, MD  traZODone (DESYREL) 50 MG tablet Take 100 mg by mouth at bedtime.    Yes Historical Provider, MD    Physical Exam: Filed Vitals:   06/15/16 1000 06/15/16 1030 06/15/16 1100 06/15/16 1152  BP: 146/76 141/61 157/60 153/71  Pulse: 75 73 72 70  Temp:      TempSrc:      Resp: 15 13 15    SpO2: 100% 100% 100% 98%     General:  Appears calm and comfortable Eyes:  PERRL, EOMI, normal lids, iris ENT:  grossly normal hearing, lips & tongue, mmm Neck:  no LAD, masses or  thyromegaly Cardiovascular:  RRR, no m/r/g. trace LE edema.  Respiratory:  CTA bilaterally, no w/r/r. Normal respiratory effort. Abdomen:  soft, ntnd, NABS Skin: Right maxillary region abrasion, no rash or induration seen on limited exam Musculoskeletal:  grossly normal tone BUE/BLE, good ROM, no bony abnormality Psychiatric: As commands, pleasant in conversation, alert and oriented 2, patient very easily forgets conversation which must be repeated. Neurologic:  CN 2-12 grossly intact, moves all extremities in coordinated fashion, sensation intact  Labs on Admission: I have personally reviewed following labs and imaging studies  CBC:  Recent Labs Lab 06/15/16 0754  WBC 11.5*  HGB 14.4  HCT 42.1  MCV 92.9  PLT [id number]   Basic Metabolic Panel:  Recent Labs Lab 06/15/16 0754  NA 136  K 3.8  CL 103  CO2 19*  GLUCOSE 184*  BUN 20  CREATININE 1.09*  CALCIUM 10.1   GFR: CrCl cannot be calculated (Unknown ideal weight.). Liver Function Tests: No results for input(s): AST, ALT, ALKPHOS, BILITOT, PROT, ALBUMIN in the last 168 hours. No results for input(s): LIPASE, AMYLASE in the last 168 hours. No results for input(s): AMMONIA in the last 168 hours. Coagulation Profile: No results for input(s): INR, PROTIME in the last 168 hours. Cardiac Enzymes: No results for input(s): CKTOTAL, CKMB, CKMBINDEX, TROPONINI in the last 168 hours. BNP (last 3 results) No results for input(s): PROBNP in the last 8760 hours. HbA1C: No results for input(s): HGBA1C in the last 72 hours. CBG: No results for input(s): GLUCAP in the last 168 hours. Lipid Profile: No results for input(s): CHOL, HDL, LDLCALC, TRIG, CHOLHDL, LDLDIRECT in the last 72 hours. Thyroid Function Tests: No results for input(s): TSH, T4TOTAL, FREET4, T3FREE, THYROIDAB in the last 72 hours. Anemia Panel: No results for input(s): VITAMINB12, FOLATE, FERRITIN, TIBC, IRON, RETICCTPCT in the last 72 hours. Urine analysis:     Component Value Date/Time   COLORURINE YELLOW 06/15/2016 0942   APPEARANCEUR TURBID* 06/15/2016 0942   LABSPEC 1.016 06/15/2016 0942   PHURINE 5.5 06/15/2016 0942   GLUCOSEU NEGATIVE 06/15/2016 0942   HGBUR TRACE* 06/15/2016 0942   BILIRUBINUR NEGATIVE 06/15/2016 0942   KETONESUR 15* 06/15/2016 0942   PROTEINUR NEGATIVE 06/15/2016 0942   NITRITE POSITIVE* 06/15/2016 0942   LEUKOCYTESUR LARGE* 06/15/2016 0942    Creatinine Clearance: CrCl cannot be calculated (Unknown ideal weight.).  Sepsis Labs: @LABRCNTIP (procalcitonin:4,lacticidven:4) )No results found for this or any previous visit (from the past 240 hour(s)).   Radiological Exams on Admission: Ct Head Wo Contrast  06/15/2016  CLINICAL DATA:  Patient found on ground post fall. Abrasion right temporal region. Dementia. EXAM: CT HEAD WITHOUT CONTRAST CT MAXILLOFACIAL WITHOUT CONTRAST CT CERVICAL SPINE WITHOUT CONTRAST TECHNIQUE: Multidetector CT imaging of the head, cervical spine, and maxillofacial structures were performed using the standard protocol without intravenous contrast. Multiplanar CT image reconstructions of the cervical spine and maxillofacial structures were  also generated. COMPARISON:  None. FINDINGS: CT HEAD FINDINGS Examination demonstrates mild age related atrophic change. Ventricles and cisterns are within normal. There is mild chronic ischemic microvascular disease. There is no mass, mass effect, shift of midline structures or acute hemorrhage. There is no evidence of acute infarction. Hyperostosis frontalis interna. Subtle mucosal membrane thickening over the sphenoid sinus. Remaining bones and soft tissues are unremarkable. CT MAXILLOFACIAL FINDINGS Orbits are normal and symmetric. Minimal focal soft tissue swelling just lateral to the right orbit. Paranasal sinuses are well developed and well aerated. Deviation of the nasal septum to the right. There subtle mucosal membrane thickening over the medial aspect of the  left maxillary sinus as well as over the sphenoid sinuses. Mastoid air cells are clear no air-fluid levels are present. No evidence of acute facial bone fracture. CT CERVICAL SPINE FINDINGS Vertebral body alignment and heights are normal. There is mild spondylosis throughout the cervical spine. There is disc space narrowing throughout the cervical spine most prominent at the C4-5 and C5-6 levels. Prevertebral soft tissues are within normal. Atlantoaxial articulation is unremarkable other than degenerative changes. There is uncovertebral joint spurring and mild facet arthropathy. Neural foraminal narrowing at several levels of the lower cervical spine. No acute fracture or subluxation. There are a few small nodules over the lung apices with the largest measuring 4 mm. Heterogeneity of the thyroid gland with prominence of the left lobe and several areas of coarse calcification likely goiter. Subtle loss of mid to anterior vertebral body height of T2 likely chronic. IMPRESSION: No acute intracranial findings. Minimal right lateral orbital soft tissue swelling. No fracture. Age related atrophic change and chronic ischemic microvascular disease. No acute facial bone fracture. Subtle chronic sinus inflammatory disease. No acute cervical spine injury. Mild spondylosis of the cervical spinal multilevel disc disease and mild multilevel neural foraminal narrowing. A few small nodules over the lung apices with the largest measuring 4 mm. Recommend noncontrast chest CT on elective basis for complete lung evaluation. Subtle loss of mid to anterior vertebral body height of TT likely chronic. Goiter. Electronically Signed   By: Marin Olp M.D.   On: 06/15/2016 09:09   Ct Cervical Spine Wo Contrast  06/15/2016  CLINICAL DATA:  Patient found on ground post fall. Abrasion right temporal region. Dementia. EXAM: CT HEAD WITHOUT CONTRAST CT MAXILLOFACIAL WITHOUT CONTRAST CT CERVICAL SPINE WITHOUT CONTRAST TECHNIQUE: Multidetector  CT imaging of the head, cervical spine, and maxillofacial structures were performed using the standard protocol without intravenous contrast. Multiplanar CT image reconstructions of the cervical spine and maxillofacial structures were also generated. COMPARISON:  None. FINDINGS: CT HEAD FINDINGS Examination demonstrates mild age related atrophic change. Ventricles and cisterns are within normal. There is mild chronic ischemic microvascular disease. There is no mass, mass effect, shift of midline structures or acute hemorrhage. There is no evidence of acute infarction. Hyperostosis frontalis interna. Subtle mucosal membrane thickening over the sphenoid sinus. Remaining bones and soft tissues are unremarkable. CT MAXILLOFACIAL FINDINGS Orbits are normal and symmetric. Minimal focal soft tissue swelling just lateral to the right orbit. Paranasal sinuses are well developed and well aerated. Deviation of the nasal septum to the right. There subtle mucosal membrane thickening over the medial aspect of the left maxillary sinus as well as over the sphenoid sinuses. Mastoid air cells are clear no air-fluid levels are present. No evidence of acute facial bone fracture. CT CERVICAL SPINE FINDINGS Vertebral body alignment and heights are normal. There is mild spondylosis throughout the cervical  spine. There is disc space narrowing throughout the cervical spine most prominent at the C4-5 and C5-6 levels. Prevertebral soft tissues are within normal. Atlantoaxial articulation is unremarkable other than degenerative changes. There is uncovertebral joint spurring and mild facet arthropathy. Neural foraminal narrowing at several levels of the lower cervical spine. No acute fracture or subluxation. There are a few small nodules over the lung apices with the largest measuring 4 mm. Heterogeneity of the thyroid gland with prominence of the left lobe and several areas of coarse calcification likely goiter. Subtle loss of mid to anterior  vertebral body height of T2 likely chronic. IMPRESSION: No acute intracranial findings. Minimal right lateral orbital soft tissue swelling. No fracture. Age related atrophic change and chronic ischemic microvascular disease. No acute facial bone fracture. Subtle chronic sinus inflammatory disease. No acute cervical spine injury. Mild spondylosis of the cervical spinal multilevel disc disease and mild multilevel neural foraminal narrowing. A few small nodules over the lung apices with the largest measuring 4 mm. Recommend noncontrast chest CT on elective basis for complete lung evaluation. Subtle loss of mid to anterior vertebral body height of TT likely chronic. Goiter. Electronically Signed   By: Marin Olp M.D.   On: 06/15/2016 09:09   Dg Humerus Left  06/15/2016  CLINICAL DATA:  Pain over both upper arms with difficulty with abduction. EXAM: LEFT HUMERUS - 2+ VIEW COMPARISON:  Left shoulder 05/04/2016 FINDINGS: Exam demonstrates diffuse decreased bone mineralization. There are minimal degenerative changes of the [facility] joint. There is no acute fracture or dislocation. Remainder of the exam is unremarkable. IMPRESSION: No acute findings. Mild degenerate change of the [facility] joint. Electronically Signed   By: Marin Olp M.D.   On: 06/15/2016 08:29   Dg Humerus Right  06/15/2016  CLINICAL DATA:  Altered mental status with complaints of bilateral upper arm pain and difficulty with abduction. EXAM: RIGHT HUMERUS - 2+ VIEW COMPARISON:  None. FINDINGS: Mild degenerative change over the [facility] joint. No evidence of acute fracture or dislocation. Mild diffuse decreased bone mineralization. IMPRESSION: No acute findings. Mild degenerate change of the [facility] joint. Electronically Signed   By: Marin Olp M.D.   On: 06/15/2016 08:09   Ct Maxillofacial Wo Cm  06/15/2016  CLINICAL DATA:  Patient found on ground post fall. Abrasion right temporal region. Dementia. EXAM: CT HEAD WITHOUT CONTRAST CT MAXILLOFACIAL WITHOUT  CONTRAST CT CERVICAL SPINE WITHOUT CONTRAST TECHNIQUE: Multidetector CT imaging of the head, cervical spine, and maxillofacial structures were performed using the standard protocol without intravenous contrast. Multiplanar CT image reconstructions of the cervical spine and maxillofacial structures were also generated. COMPARISON:  None. FINDINGS: CT HEAD FINDINGS Examination demonstrates mild age related atrophic change. Ventricles and cisterns are within normal. There is mild chronic ischemic microvascular disease. There is no mass, mass effect, shift of midline structures or acute hemorrhage. There is no evidence of acute infarction. Hyperostosis frontalis interna. Subtle mucosal membrane thickening over the sphenoid sinus. Remaining bones and soft tissues are unremarkable. CT MAXILLOFACIAL FINDINGS Orbits are normal and symmetric. Minimal focal soft tissue swelling just lateral to the right orbit. Paranasal sinuses are well developed and well aerated. Deviation of the nasal septum to the right. There subtle mucosal membrane thickening over the medial aspect of the left maxillary sinus as well as over the sphenoid sinuses. Mastoid air cells are clear no air-fluid levels are present. No evidence of acute facial bone fracture. CT CERVICAL SPINE FINDINGS Vertebral body alignment and heights are normal. There is  mild spondylosis throughout the cervical spine. There is disc space narrowing throughout the cervical spine most prominent at the C4-5 and C5-6 levels. Prevertebral soft tissues are within normal. Atlantoaxial articulation is unremarkable other than degenerative changes. There is uncovertebral joint spurring and mild facet arthropathy. Neural foraminal narrowing at several levels of the lower cervical spine. No acute fracture or subluxation. There are a few small nodules over the lung apices with the largest measuring 4 mm. Heterogeneity of the thyroid gland with prominence of the left lobe and several areas  of coarse calcification likely goiter. Subtle loss of mid to anterior vertebral body height of T2 likely chronic. IMPRESSION: No acute intracranial findings. Minimal right lateral orbital soft tissue swelling. No fracture. Age related atrophic change and chronic ischemic microvascular disease. No acute facial bone fracture. Subtle chronic sinus inflammatory disease. No acute cervical spine injury. Mild spondylosis of the cervical spinal multilevel disc disease and mild multilevel neural foraminal narrowing. A few small nodules over the lung apices with the largest measuring 4 mm. Recommend noncontrast chest CT on elective basis for complete lung evaluation. Subtle loss of mid to anterior vertebral body height of TT likely chronic. Goiter. Electronically Signed   By: Marin Olp M.D.   On: 06/15/2016 09:09    EKG: Independently reviewed.   Assessment/Plan Active Problems:   Acute encephalopathy   UTI (lower urinary tract infection)   Fall   Essential hypertension   Diabetes mellitus with complication (HCC)   Dementia   Acute encephalopathy: Resolved. Patient's dementia has left her alert and oriented to person at baseline but requires daily assistance with ADLs. Suspect this resolved quickly after getting patient out of the heat, fluids, and antibiotics for treatment of UTI. - Med surge - Continue Haldol, Ativan, trazodone PRN  Urinary tract infection: UA grossly abnormal though contaminated. Started on ceftriaxone in the ED. Patient is asymptomatic. AF VSS - Ceftriaxone - Urine culture, blood culture - IVF  Fall: unwitnessed. Found down on side of street. Hit head. Imaging reasuring - no fracture or ICH. Suspect mechanical. EKG nml.  - Tele - Orthostatics - Ambulate pt - daily ABX oint for skin abrasion  Hypertension: Fairly normotensive - Continue metoprolol, lisinopril, HCTZ  Diabetes: On oral agents only - SSI - A1c    DVT prophylaxis: SCD and regular ambulation  Code  Status: FULL - confirmed  Family Communication: Best friend of several years   Disposition Plan: pending improvement   Consults called: none  Admission status: tele obs    Perline Awe J MD [facility]  If 7PM-7AM, please contact night-coverage [email or web address] Password [facility]  06/15/2016, 12:05 PM

## 2016-06-15 NOTE — ED Notes (Signed)
According to EMS pt was found sitting on a curb w/ an abrasion to the right side of her face not oriented at all, unable to answer any questions.  Eventually was able to relay address, EMS found a neighbor who was able to give a packet full of information reporting that the pt has a hx of dementia.  She lives alone and was clear w/ EMS that she did not want her children to know the situation, she is sure she fell at the end of her driveway however she was found at [id number] stating she always goes for walks in the dark.  There are no blood thinners on her med list, could not report LOC.

## 2016-06-15 NOTE — ED Notes (Signed)
Patient voided on bedpan but had fecal matter in the bedpan.  Will try an in and out on patient for clean sample.

## 2016-06-15 NOTE — Progress Notes (Signed)
Patient arrived to 5M10 from ED. Pt is alert and pleasant. Safety precautions and orders reviewed with patient. Call light within reach. Diet order. TELE applied.  No other distress noted. Will continue to monitor.   Ave Filter, RN

## 2016-06-15 NOTE — ED Notes (Signed)
Phlebotomy at bedside.

## 2016-06-15 NOTE — ED Notes (Signed)
Rocephin hanging and running.  Patient is confused and unable to tell the month and year.  Patient has had family (neighbor) at bedside but has gone home.  This RN contacted and notified that patient is being admitted.

## 2016-06-15 NOTE — ED Provider Notes (Signed)
CSN: [id number]     Arrival date & time 06/15/16  0715 History   First MD Initiated Contact with Patient 06/15/16 [id number]     No chief complaint on file.    (Consider location/radiation/quality/duration/timing/severity/associated sxs/prior Treatment) HPI Comments: 82 year old female with reported history of dementia, lives alone, high blood pressure, diabetes presents after being found at the curb down the street from her house with abrasion to right side of her face. Patient was oriented to city however thought she was at her house. No family nearby. Patient thought she fell at her driveway however she was at a different home. No blood thinners on her med list however she thinks she might be on Coumadin. Patient has pain in the right face but denies pain anywhere else.  The history is provided by the patient, the EMS personnel and medical records.    Past Medical History  Diagnosis Date  . Hyperlipidemia   . Hypertension   . Diabetes mellitus without complication [facility])    Past Surgical History  Procedure Laterality Date  . Cholecystectomy     No family history on file. Social History  Substance Use Topics  . Smoking status: Former Research scientist (life sciences)  . Smokeless tobacco: None  . Alcohol Use: No   OB History    No data available     Review of Systems  Constitutional: Negative for fever and chills.  HENT: Negative for congestion.   Eyes: Negative for visual disturbance.  Respiratory: Negative for shortness of breath.   Cardiovascular: Negative for chest pain.  Gastrointestinal: Negative for vomiting and abdominal pain.  Genitourinary: Negative for dysuria and flank pain.  Musculoskeletal: Negative for back pain, neck pain and neck stiffness.  Skin: Positive for wound.  Neurological: Positive for headaches. Negative for light-headedness.      Allergies  Review of patient's allergies indicates no known allergies.  Home Medications   Prior to Admission medications   Medication Sig  Start Date End Date Taking? Authorizing Provider  cetirizine (ZYRTEC) 10 MG tablet Take 10 mg by mouth daily.   Yes Historical Provider, MD  glimepiride (AMARYL) 4 MG tablet Take 4 mg by mouth daily with breakfast.   Yes Historical Provider, MD  haloperidol (HALDOL) 0.5 MG tablet Take 0.25 mg by mouth daily.  08/31/15  Yes Historical Provider, MD  hydrochlorothiazide (HYDRODIURIL) 25 MG tablet Take 25 mg by mouth daily.   Yes Historical Provider, MD  lisinopril (PRINIVIL,ZESTRIL) 40 MG tablet Take 40 mg by mouth daily.   Yes Historical Provider, MD  LORazepam (ATIVAN) 0.5 MG tablet Take 0.25 mg by mouth every 8 (eight) hours as needed for anxiety.  10/22/15  Yes Historical Provider, MD  metFORMIN (GLUCOPHAGE) 500 MG tablet Take 500-1,000 mg by mouth 2 (two) times daily with a meal. 1000mg  in the morning, 500mg  in the evening   Yes Historical Provider, MD  metoprolol tartrate (LOPRESSOR) 25 MG tablet Take 25 mg by mouth daily.   Yes Historical Provider, MD  Multiple Vitamin (HEALTHY HAIR/SKIN/NAILS) TABS Take 1 tablet by mouth daily.    Yes Historical Provider, MD  traZODone (DESYREL) 50 MG tablet Take 100 mg by mouth at bedtime.    Yes Historical Provider, MD   BP 159/89 mmHg  Pulse 75  Temp(Src) 97.6 F (36.4 C) (Oral)  Resp 11  SpO2 100% Physical Exam  Constitutional: She appears well-developed and well-nourished.  HENT:  Head: Normocephalic.  No midline cervical tenderness, patient has large abrasion with mild bleeding right maxillary region approximately  6 cm diameter, tender to palpation neck supple full range of motion without pain.  Eyes: Conjunctivae are normal. Right eye exhibits no discharge. Left eye exhibits no discharge.  Neck: Normal range of motion. Neck supple. No tracheal deviation present.  Cardiovascular: Normal rate and regular rhythm.   Pulmonary/Chest: Effort normal and breath sounds normal.  Abdominal: Soft. She exhibits no distension. There is no tenderness. There is  no guarding.  Musculoskeletal: She exhibits tenderness. She exhibits no edema.  Neurological: She is alert. GCS eye subscore is 4. GCS verbal subscore is 4. GCS motor subscore is 6.  Patient knows her name, city however cannot give details. Patient does know her daughter live in [place]. Patient has equal 5+ strength upper lower extremities bilateral, mild pain with raising her left arm and the upper arm area. Patient sensation intact palpation in all extremities. X ocular muscle function intact pupils are equal no facial droop.  Skin: Skin is warm. No rash noted.  Nursing note and vitals reviewed.   ED Course  Procedures (including critical care time) Labs Review Labs Reviewed  CBC - Abnormal; Notable for the following:    WBC 11.5 (*)    All other components within normal limits  BASIC METABOLIC PANEL - Abnormal; Notable for the following:    CO2 19 (*)    Glucose, Bld 184 (*)    Creatinine, Ser 1.09 (*)    GFR calc non Af Amer 45 (*)    GFR calc Af Amer 52 (*)    All other components within normal limits  URINALYSIS, ROUTINE W REFLEX MICROSCOPIC (NOT AT [facility]) - Abnormal; Notable for the following:    APPearance TURBID (*)    Hgb urine dipstick TRACE (*)    Ketones, ur 15 (*)    Nitrite POSITIVE (*)    Leukocytes, UA LARGE (*)    All other components within normal limits  URINE MICROSCOPIC-ADD ON - Abnormal; Notable for the following:    Squamous Epithelial / LPF 6-30 (*)    Bacteria, UA MANY (*)    All other components within normal limits  URINE CULTURE    Imaging Review Ct Head Wo Contrast  06/15/2016  CLINICAL DATA:  Patient found on ground post fall. Abrasion right temporal region. Dementia. EXAM: CT HEAD WITHOUT CONTRAST CT MAXILLOFACIAL WITHOUT CONTRAST CT CERVICAL SPINE WITHOUT CONTRAST TECHNIQUE: Multidetector CT imaging of the head, cervical spine, and maxillofacial structures were performed using the standard protocol without intravenous contrast. Multiplanar CT  image reconstructions of the cervical spine and maxillofacial structures were also generated. COMPARISON:  None. FINDINGS: CT HEAD FINDINGS Examination demonstrates mild age related atrophic change. Ventricles and cisterns are within normal. There is mild chronic ischemic microvascular disease. There is no mass, mass effect, shift of midline structures or acute hemorrhage. There is no evidence of acute infarction. Hyperostosis frontalis interna. Subtle mucosal membrane thickening over the sphenoid sinus. Remaining bones and soft tissues are unremarkable. CT MAXILLOFACIAL FINDINGS Orbits are normal and symmetric. Minimal focal soft tissue swelling just lateral to the right orbit. Paranasal sinuses are well developed and well aerated. Deviation of the nasal septum to the right. There subtle mucosal membrane thickening over the medial aspect of the left maxillary sinus as well as over the sphenoid sinuses. Mastoid air cells are clear no air-fluid levels are present. No evidence of acute facial bone fracture. CT CERVICAL SPINE FINDINGS Vertebral body alignment and heights are normal. There is mild spondylosis throughout the cervical spine. There is disc  space narrowing throughout the cervical spine most prominent at the C4-5 and C5-6 levels. Prevertebral soft tissues are within normal. Atlantoaxial articulation is unremarkable other than degenerative changes. There is uncovertebral joint spurring and mild facet arthropathy. Neural foraminal narrowing at several levels of the lower cervical spine. No acute fracture or subluxation. There are a few small nodules over the lung apices with the largest measuring 4 mm. Heterogeneity of the thyroid gland with prominence of the left lobe and several areas of coarse calcification likely goiter. Subtle loss of mid to anterior vertebral body height of T2 likely chronic. IMPRESSION: No acute intracranial findings. Minimal right lateral orbital soft tissue swelling. No fracture. Age  related atrophic change and chronic ischemic microvascular disease. No acute facial bone fracture. Subtle chronic sinus inflammatory disease. No acute cervical spine injury. Mild spondylosis of the cervical spinal multilevel disc disease and mild multilevel neural foraminal narrowing. A few small nodules over the lung apices with the largest measuring 4 mm. Recommend noncontrast chest CT on elective basis for complete lung evaluation. Subtle loss of mid to anterior vertebral body height of TT likely chronic. Goiter. Electronically Signed   By: Marin Olp M.D.   On: 06/15/2016 09:09   Ct Cervical Spine Wo Contrast  06/15/2016  CLINICAL DATA:  Patient found on ground post fall. Abrasion right temporal region. Dementia. EXAM: CT HEAD WITHOUT CONTRAST CT MAXILLOFACIAL WITHOUT CONTRAST CT CERVICAL SPINE WITHOUT CONTRAST TECHNIQUE: Multidetector CT imaging of the head, cervical spine, and maxillofacial structures were performed using the standard protocol without intravenous contrast. Multiplanar CT image reconstructions of the cervical spine and maxillofacial structures were also generated. COMPARISON:  None. FINDINGS: CT HEAD FINDINGS Examination demonstrates mild age related atrophic change. Ventricles and cisterns are within normal. There is mild chronic ischemic microvascular disease. There is no mass, mass effect, shift of midline structures or acute hemorrhage. There is no evidence of acute infarction. Hyperostosis frontalis interna. Subtle mucosal membrane thickening over the sphenoid sinus. Remaining bones and soft tissues are unremarkable. CT MAXILLOFACIAL FINDINGS Orbits are normal and symmetric. Minimal focal soft tissue swelling just lateral to the right orbit. Paranasal sinuses are well developed and well aerated. Deviation of the nasal septum to the right. There subtle mucosal membrane thickening over the medial aspect of the left maxillary sinus as well as over the sphenoid sinuses. Mastoid air cells  are clear no air-fluid levels are present. No evidence of acute facial bone fracture. CT CERVICAL SPINE FINDINGS Vertebral body alignment and heights are normal. There is mild spondylosis throughout the cervical spine. There is disc space narrowing throughout the cervical spine most prominent at the C4-5 and C5-6 levels. Prevertebral soft tissues are within normal. Atlantoaxial articulation is unremarkable other than degenerative changes. There is uncovertebral joint spurring and mild facet arthropathy. Neural foraminal narrowing at several levels of the lower cervical spine. No acute fracture or subluxation. There are a few small nodules over the lung apices with the largest measuring 4 mm. Heterogeneity of the thyroid gland with prominence of the left lobe and several areas of coarse calcification likely goiter. Subtle loss of mid to anterior vertebral body height of T2 likely chronic. IMPRESSION: No acute intracranial findings. Minimal right lateral orbital soft tissue swelling. No fracture. Age related atrophic change and chronic ischemic microvascular disease. No acute facial bone fracture. Subtle chronic sinus inflammatory disease. No acute cervical spine injury. Mild spondylosis of the cervical spinal multilevel disc disease and mild multilevel neural foraminal narrowing. A few small nodules  over the lung apices with the largest measuring 4 mm. Recommend noncontrast chest CT on elective basis for complete lung evaluation. Subtle loss of mid to anterior vertebral body height of TT likely chronic. Goiter. Electronically Signed   By: Marin Olp M.D.   On: 06/15/2016 09:09   Dg Humerus Left  06/15/2016  CLINICAL DATA:  Pain over both upper arms with difficulty with abduction. EXAM: LEFT HUMERUS - 2+ VIEW COMPARISON:  Left shoulder 05/04/2016 FINDINGS: Exam demonstrates diffuse decreased bone mineralization. There are minimal degenerative changes of the [facility] joint. There is no acute fracture or dislocation.  Remainder of the exam is unremarkable. IMPRESSION: No acute findings. Mild degenerate change of the [facility] joint. Electronically Signed   By: Marin Olp M.D.   On: 06/15/2016 08:29   Dg Humerus Right  06/15/2016  CLINICAL DATA:  Altered mental status with complaints of bilateral upper arm pain and difficulty with abduction. EXAM: RIGHT HUMERUS - 2+ VIEW COMPARISON:  None. FINDINGS: Mild degenerative change over the [facility] joint. No evidence of acute fracture or dislocation. Mild diffuse decreased bone mineralization. IMPRESSION: No acute findings. Mild degenerate change of the [facility] joint. Electronically Signed   By: Marin Olp M.D.   On: 06/15/2016 08:09   Ct Maxillofacial Wo Cm  06/15/2016  CLINICAL DATA:  Patient found on ground post fall. Abrasion right temporal region. Dementia. EXAM: CT HEAD WITHOUT CONTRAST CT MAXILLOFACIAL WITHOUT CONTRAST CT CERVICAL SPINE WITHOUT CONTRAST TECHNIQUE: Multidetector CT imaging of the head, cervical spine, and maxillofacial structures were performed using the standard protocol without intravenous contrast. Multiplanar CT image reconstructions of the cervical spine and maxillofacial structures were also generated. COMPARISON:  None. FINDINGS: CT HEAD FINDINGS Examination demonstrates mild age related atrophic change. Ventricles and cisterns are within normal. There is mild chronic ischemic microvascular disease. There is no mass, mass effect, shift of midline structures or acute hemorrhage. There is no evidence of acute infarction. Hyperostosis frontalis interna. Subtle mucosal membrane thickening over the sphenoid sinus. Remaining bones and soft tissues are unremarkable. CT MAXILLOFACIAL FINDINGS Orbits are normal and symmetric. Minimal focal soft tissue swelling just lateral to the right orbit. Paranasal sinuses are well developed and well aerated. Deviation of the nasal septum to the right. There subtle mucosal membrane thickening over the medial aspect of the left  maxillary sinus as well as over the sphenoid sinuses. Mastoid air cells are clear no air-fluid levels are present. No evidence of acute facial bone fracture. CT CERVICAL SPINE FINDINGS Vertebral body alignment and heights are normal. There is mild spondylosis throughout the cervical spine. There is disc space narrowing throughout the cervical spine most prominent at the C4-5 and C5-6 levels. Prevertebral soft tissues are within normal. Atlantoaxial articulation is unremarkable other than degenerative changes. There is uncovertebral joint spurring and mild facet arthropathy. Neural foraminal narrowing at several levels of the lower cervical spine. No acute fracture or subluxation. There are a few small nodules over the lung apices with the largest measuring 4 mm. Heterogeneity of the thyroid gland with prominence of the left lobe and several areas of coarse calcification likely goiter. Subtle loss of mid to anterior vertebral body height of T2 likely chronic. IMPRESSION: No acute intracranial findings. Minimal right lateral orbital soft tissue swelling. No fracture. Age related atrophic change and chronic ischemic microvascular disease. No acute facial bone fracture. Subtle chronic sinus inflammatory disease. No acute cervical spine injury. Mild spondylosis of the cervical spinal multilevel disc disease and mild multilevel neural foraminal  narrowing. A few small nodules over the lung apices with the largest measuring 4 mm. Recommend noncontrast chest CT on elective basis for complete lung evaluation. Subtle loss of mid to anterior vertebral body height of TT likely chronic. Goiter. Electronically Signed   By: Marin Olp M.D.   On: 06/15/2016 09:09   I have personally reviewed and evaluated these images and lab results as part of my medical decision-making.   EKG Interpretation None      MDM   Final diagnoses:  UTI (lower urinary tract infection)  Acute encephalopathy  Facial contusion, initial  encounter  Fall, initial encounter   Patient presents after unwitnessed fall, she states that she normally goes for a walk every day and tripped. Patient does have mild confusion on exam unable to know if this is baseline dementia for her. Aside from her face and mild confusion no other signs of significant injury. Mild left arm pain with palpation. Plan for x-rays, CTs, blood work, urinalysis. Nursing trying to contact family. Pulmonary nodule and need outpatient follow-up. Patient does have a urine infection and with worsening confusion/encephalopathy plan for admission. Results and differential diagnosis were discussed with the patient/parent/guardian. Xrays were independently reviewed by myself.  Close follow up outpatient was discussed, comfortable with the plan.   Medications  cefTRIAXone (ROCEPHIN) 1 g in dextrose 5 % 50 mL IVPB (not administered)  acetaminophen (TYLENOL) tablet 1,000 mg (1,000 mg Oral Given 06/15/16 0846)  sodium chloride 0.9 % bolus 500 mL (500 mLs Intravenous New Bag/Given 06/15/16 1010)    Filed Vitals:   06/15/16 0830 06/15/16 0845 06/15/16 0900 06/15/16 0930  BP: 172/83 151/83 164/77 159/89  Pulse: 85 81 89 75  Temp:      TempSrc:      Resp: 17 9 20 11   SpO2: 98% 100% 100% 100%    Final diagnoses:  UTI (lower urinary tract infection)  Acute encephalopathy  Facial contusion, initial encounter  Fall, initial encounter       Elnora Morrison, MD 06/15/16 1106

## 2016-06-16 DIAGNOSIS — E118 Type 2 diabetes mellitus with unspecified complications: Secondary | ICD-10-CM

## 2016-06-16 DIAGNOSIS — R402142 Coma scale, eyes open, spontaneous, at arrival to emergency department: Secondary | ICD-10-CM | POA: Diagnosis present

## 2016-06-16 DIAGNOSIS — Z7984 Long term (current) use of oral hypoglycemic drugs: Secondary | ICD-10-CM | POA: Diagnosis not present

## 2016-06-16 DIAGNOSIS — Z87891 Personal history of nicotine dependence: Secondary | ICD-10-CM | POA: Diagnosis not present

## 2016-06-16 DIAGNOSIS — S0081XA Abrasion of other part of head, initial encounter: Secondary | ICD-10-CM | POA: Diagnosis present

## 2016-06-16 DIAGNOSIS — R402362 Coma scale, best motor response, obeys commands, at arrival to emergency department: Secondary | ICD-10-CM | POA: Diagnosis present

## 2016-06-16 DIAGNOSIS — W1830XA Fall on same level, unspecified, initial encounter: Secondary | ICD-10-CM | POA: Diagnosis present

## 2016-06-16 DIAGNOSIS — I10 Essential (primary) hypertension: Secondary | ICD-10-CM | POA: Diagnosis not present

## 2016-06-16 DIAGNOSIS — G9340 Encephalopathy, unspecified: Secondary | ICD-10-CM | POA: Diagnosis not present

## 2016-06-16 DIAGNOSIS — Y92410 Unspecified street and highway as the place of occurrence of the external cause: Secondary | ICD-10-CM | POA: Diagnosis not present

## 2016-06-16 DIAGNOSIS — Z79899 Other long term (current) drug therapy: Secondary | ICD-10-CM | POA: Diagnosis not present

## 2016-06-16 DIAGNOSIS — R402242 Coma scale, best verbal response, confused conversation, at arrival to emergency department: Secondary | ICD-10-CM | POA: Diagnosis present

## 2016-06-16 DIAGNOSIS — S0083XA Contusion of other part of head, initial encounter: Secondary | ICD-10-CM | POA: Diagnosis present

## 2016-06-16 DIAGNOSIS — N390 Urinary tract infection, site not specified: Secondary | ICD-10-CM | POA: Diagnosis not present

## 2016-06-16 DIAGNOSIS — E119 Type 2 diabetes mellitus without complications: Secondary | ICD-10-CM | POA: Diagnosis present

## 2016-06-16 DIAGNOSIS — F0390 Unspecified dementia without behavioral disturbance: Secondary | ICD-10-CM | POA: Diagnosis present

## 2016-06-16 LAB — CBC
HCT: 39.3 % (ref 36.0–46.0)
Hemoglobin: 13.9 g/dL (ref 12.0–15.0)
MCH: 31.9 pg (ref 26.0–34.0)
MCHC: 35.4 g/dL (ref 30.0–36.0)
MCV: 90.1 fL (ref 78.0–100.0)
PLATELETS: 151 10*3/uL (ref 150–400)
RBC: 4.36 MIL/uL (ref 3.87–5.11)
RDW: 13.2 % (ref 11.5–15.5)
WBC: 11.7 10*3/uL — AB (ref 4.0–10.5)

## 2016-06-16 LAB — BASIC METABOLIC PANEL
ANION GAP: 14 (ref 5–15)
BUN: 13 mg/dL (ref 6–20)
CALCIUM: 9.5 mg/dL (ref 8.9–10.3)
CO2: 21 mmol/L — ABNORMAL LOW (ref 22–32)
Chloride: 98 mmol/L — ABNORMAL LOW (ref 101–111)
Creatinine, Ser: 0.84 mg/dL (ref 0.44–1.00)
GFR calc Af Amer: >60 mL/min (ref >60)
Glucose, Bld: 135 mg/dL — ABNORMAL HIGH (ref 65–99)
POTASSIUM: 3.6 mmol/L (ref 3.5–5.1)
SODIUM: 133 mmol/L — AB (ref 135–145)

## 2016-06-16 LAB — GLUCOSE, CAPILLARY
GLUCOSE-CAPILLARY: 120 mg/dL — AB (ref 65–99)
GLUCOSE-CAPILLARY: 132 mg/dL — AB (ref 65–99)
GLUCOSE-CAPILLARY: 139 mg/dL — AB (ref 65–99)
Glucose-Capillary: 175 mg/dL — ABNORMAL HIGH (ref 65–99)
Glucose-Capillary: 130 mg/dL — ABNORMAL HIGH (ref 65–99)

## 2016-06-16 LAB — HEMOGLOBIN A1C
HEMOGLOBIN A1C: 5.7 % — AB (ref 4.8–5.6)
Mean Plasma Glucose: 117 mg/dL

## 2016-06-16 NOTE — Care Management Obs Status (Signed)
[place] NOTIFICATION   Patient Details  Name: CYNTORIA HETZEL MRN: [id number] Date of Birth: 09-Dec-1927   Medicare Observation Status Notification Given:  Yes, pt unable to sign due to her Dementia. Friends and Caregiver at bedside voiced no questions. Copy of [facility] letter left at bedside.     Delrae Sawyers, RN 06/16/2016, 11:44 AM

## 2016-06-16 NOTE — Progress Notes (Signed)
Will wait for PT/OT evaluation to assess needs of pt. CM will continue to follow

## 2016-06-16 NOTE — Care Management Note (Signed)
Case Management Note  Patient Details  Name: NAIRA TEJADA MRN: [id number] Date of Birth: September 25, 1928  Subjective/Objective:   82 y.o. F admitted 06/15/2016 after being found outside of her home where she had  fallen. Caregiver at bedside says she normally does not have supervision at night and generally does not leave her home. Currently being treated for UTI and caregiver reports she is at baseline.                 Action/Plan:CM will continue to follow for disposition. Awaiting PT/OT evaluation.    Expected Discharge Date:                  Expected Discharge Plan:     In-House Referral:     Discharge planning Services  CM Consult  Post Acute Care Choice:    Choice offered to:     DME Arranged:    DME Agency:     HH Arranged:    HH Agency:     Status of Service:  In process, will continue to follow  If discussed at Long Length of Stay Meetings, dates discussed:    Additional Comments:  Delrae Sawyers, RN 06/16/2016, 11:46 AM

## 2016-06-16 NOTE — Progress Notes (Signed)
[facility] PROGRESS NOTE  Yolanda Kennedy [id number] DOB: [birth year] DOA: 06/15/2016 PCP: Criselda Peaches, MD  Assessment/Plan: Acute encephalopathy: Resolved. Patient's dementia has left her alert and oriented to person at baseline but requires daily assistance with ADLs. Suspect this resolved quickly after getting patient out of the heat, fluids, and antibiotics for treatment of UTI. - Med surg admit - Continue Haldol, Ativan, trazodone PRN  Urinary tract infection: UA grossly abnormal though contaminated. Started on ceftriaxone in the ED. Patient is asymptomatic. AF VSS - Ceftriaxone - Urine culture - > GNR - IVF  Fall: unwitnessed. Found down on side of street. Hit head. Imaging reasuring - no fracture or ICH. Suspect mechanical. EKG nml.  - Tele - Orthostatics - Ambulate pt - daily ABX oint for skin abrasion  Hypertension: Fairly normotensive - Continue metoprolol, lisinopril, HCTZ  Diabetes: On oral agents only, held - SSI - A1c   DVT prophylaxis: SCD and regular ambulation  Code Status: FULL - confirmed  Family Communication: Best friend of several years  Disposition Plan: pending improvement  Consults called: none  Admission status: tele obs   Consultants:  n/a  Procedures:  n/a  Antibiotics:  Rocephin 6/28 - > P (indicate start date, and stop date if known)  HPI/Subjective: No complaints. Doing well. No pain. No fever.  Objective: Filed Vitals:   06/16/16 1819 06/16/16 2141  BP: 177/56 172/61  Pulse: 70 56  Temp: 98.5 F (36.9 C) 98 F (36.7 C)  Resp: 20 20   No intake or output data in the 24 hours ending 06/16/16 2222 There were no vitals filed for this visit.  Exam:  General:  No diaphoresis, anxious, no acute distress Cardiovascular: Regular rate and rhythm no murmurs rubs or gallops Respiratory: Clear to auscultation bilaterally no more breathing Abdomen: Nondistended bowel sounds normal nontender  palpation Musculoskeletal: Moving all extremities, no deformity, 5 out of 5 strength   Data Reviewed: Basic Metabolic Panel:  Recent Labs Lab 06/15/16 0754 06/16/16 0324  NA 136 133*  K 3.8 3.6  CL 103 98*  CO2 19* 21*  GLUCOSE 184* 135*  BUN 20 13  CREATININE 1.09* 0.84  CALCIUM 10.1 9.5   Liver Function Tests: No results for input(s): AST, ALT, ALKPHOS, BILITOT, PROT, ALBUMIN in the last 168 hours. No results for input(s): LIPASE, AMYLASE in the last 168 hours. No results for input(s): AMMONIA in the last 168 hours. CBC:  Recent Labs Lab 06/15/16 0754 06/16/16 0324  WBC 11.5* 11.7*  HGB 14.4 13.9  HCT 42.1 39.3  MCV 92.9 90.1  PLT 171 151   Cardiac Enzymes: No results for input(s): CKTOTAL, CKMB, CKMBINDEX, TROPONINI in the last 168 hours. BNP (last 3 results) No results for input(s): BNP in the last 8760 hours.  ProBNP (last 3 results) No results for input(s): PROBNP in the last 8760 hours.  CBG:  Recent Labs Lab 06/15/16 2214 06/16/16 0636 06/16/16 1130 06/16/16 1633 06/16/16 2136  GLUCAP 120* 132* 175* 139* 130*    Recent Results (from the past 240 hour(s))  Urine culture     Status: Abnormal (Preliminary result)   Collection Time: 06/15/16  9:42 AM  Result Value Ref Range Status   Specimen Description URINE, CATHETERIZED  Final   Special Requests NONE  Final   Culture >=100,000 COLONIES/mL GRAM NEGATIVE RODS (A)  Final   Report Status PENDING  Incomplete     Studies: Ct Head Wo Contrast  06/15/2016  CLINICAL DATA:  Patient found  on ground post fall. Abrasion right temporal region. Dementia. EXAM: CT HEAD WITHOUT CONTRAST CT MAXILLOFACIAL WITHOUT CONTRAST CT CERVICAL SPINE WITHOUT CONTRAST TECHNIQUE: Multidetector CT imaging of the head, cervical spine, and maxillofacial structures were performed using the standard protocol without intravenous contrast. Multiplanar CT image reconstructions of the cervical spine and maxillofacial structures were  also generated. COMPARISON:  None. FINDINGS: CT HEAD FINDINGS Examination demonstrates mild age related atrophic change. Ventricles and cisterns are within normal. There is mild chronic ischemic microvascular disease. There is no mass, mass effect, shift of midline structures or acute hemorrhage. There is no evidence of acute infarction. Hyperostosis frontalis interna. Subtle mucosal membrane thickening over the sphenoid sinus. Remaining bones and soft tissues are unremarkable. CT MAXILLOFACIAL FINDINGS Orbits are normal and symmetric. Minimal focal soft tissue swelling just lateral to the right orbit. Paranasal sinuses are well developed and well aerated. Deviation of the nasal septum to the right. There subtle mucosal membrane thickening over the medial aspect of the left maxillary sinus as well as over the sphenoid sinuses. Mastoid air cells are clear no air-fluid levels are present. No evidence of acute facial bone fracture. CT CERVICAL SPINE FINDINGS Vertebral body alignment and heights are normal. There is mild spondylosis throughout the cervical spine. There is disc space narrowing throughout the cervical spine most prominent at the C4-5 and C5-6 levels. Prevertebral soft tissues are within normal. Atlantoaxial articulation is unremarkable other than degenerative changes. There is uncovertebral joint spurring and mild facet arthropathy. Neural foraminal narrowing at several levels of the lower cervical spine. No acute fracture or subluxation. There are a few small nodules over the lung apices with the largest measuring 4 mm. Heterogeneity of the thyroid gland with prominence of the left lobe and several areas of coarse calcification likely goiter. Subtle loss of mid to anterior vertebral body height of T2 likely chronic. IMPRESSION: No acute intracranial findings. Minimal right lateral orbital soft tissue swelling. No fracture. Age related atrophic change and chronic ischemic microvascular disease. No acute  facial bone fracture. Subtle chronic sinus inflammatory disease. No acute cervical spine injury. Mild spondylosis of the cervical spinal multilevel disc disease and mild multilevel neural foraminal narrowing. A few small nodules over the lung apices with the largest measuring 4 mm. Recommend noncontrast chest CT on elective basis for complete lung evaluation. Subtle loss of mid to anterior vertebral body height of TT likely chronic. Goiter. Electronically Signed   By: Marin Olp M.D.   On: 06/15/2016 09:09   Ct Cervical Spine Wo Contrast  06/15/2016  CLINICAL DATA:  Patient found on ground post fall. Abrasion right temporal region. Dementia. EXAM: CT HEAD WITHOUT CONTRAST CT MAXILLOFACIAL WITHOUT CONTRAST CT CERVICAL SPINE WITHOUT CONTRAST TECHNIQUE: Multidetector CT imaging of the head, cervical spine, and maxillofacial structures were performed using the standard protocol without intravenous contrast. Multiplanar CT image reconstructions of the cervical spine and maxillofacial structures were also generated. COMPARISON:  None. FINDINGS: CT HEAD FINDINGS Examination demonstrates mild age related atrophic change. Ventricles and cisterns are within normal. There is mild chronic ischemic microvascular disease. There is no mass, mass effect, shift of midline structures or acute hemorrhage. There is no evidence of acute infarction. Hyperostosis frontalis interna. Subtle mucosal membrane thickening over the sphenoid sinus. Remaining bones and soft tissues are unremarkable. CT MAXILLOFACIAL FINDINGS Orbits are normal and symmetric. Minimal focal soft tissue swelling just lateral to the right orbit. Paranasal sinuses are well developed and well aerated. Deviation of the nasal septum to the right.  There subtle mucosal membrane thickening over the medial aspect of the left maxillary sinus as well as over the sphenoid sinuses. Mastoid air cells are clear no air-fluid levels are present. No evidence of acute facial bone  fracture. CT CERVICAL SPINE FINDINGS Vertebral body alignment and heights are normal. There is mild spondylosis throughout the cervical spine. There is disc space narrowing throughout the cervical spine most prominent at the C4-5 and C5-6 levels. Prevertebral soft tissues are within normal. Atlantoaxial articulation is unremarkable other than degenerative changes. There is uncovertebral joint spurring and mild facet arthropathy. Neural foraminal narrowing at several levels of the lower cervical spine. No acute fracture or subluxation. There are a few small nodules over the lung apices with the largest measuring 4 mm. Heterogeneity of the thyroid gland with prominence of the left lobe and several areas of coarse calcification likely goiter. Subtle loss of mid to anterior vertebral body height of T2 likely chronic. IMPRESSION: No acute intracranial findings. Minimal right lateral orbital soft tissue swelling. No fracture. Age related atrophic change and chronic ischemic microvascular disease. No acute facial bone fracture. Subtle chronic sinus inflammatory disease. No acute cervical spine injury. Mild spondylosis of the cervical spinal multilevel disc disease and mild multilevel neural foraminal narrowing. A few small nodules over the lung apices with the largest measuring 4 mm. Recommend noncontrast chest CT on elective basis for complete lung evaluation. Subtle loss of mid to anterior vertebral body height of TT likely chronic. Goiter. Electronically Signed   By: Marin Olp M.D.   On: 06/15/2016 09:09   Dg Humerus Left  06/15/2016  CLINICAL DATA:  Pain over both upper arms with difficulty with abduction. EXAM: LEFT HUMERUS - 2+ VIEW COMPARISON:  Left shoulder 05/04/2016 FINDINGS: Exam demonstrates diffuse decreased bone mineralization. There are minimal degenerative changes of the [facility] joint. There is no acute fracture or dislocation. Remainder of the exam is unremarkable. IMPRESSION: No acute findings. Mild  degenerate change of the [facility] joint. Electronically Signed   By: Marin Olp M.D.   On: 06/15/2016 08:29   Dg Humerus Right  06/15/2016  CLINICAL DATA:  Altered mental status with complaints of bilateral upper arm pain and difficulty with abduction. EXAM: RIGHT HUMERUS - 2+ VIEW COMPARISON:  None. FINDINGS: Mild degenerative change over the [facility] joint. No evidence of acute fracture or dislocation. Mild diffuse decreased bone mineralization. IMPRESSION: No acute findings. Mild degenerate change of the [facility] joint. Electronically Signed   By: Marin Olp M.D.   On: 06/15/2016 08:09   Ct Maxillofacial Wo Cm  06/15/2016  CLINICAL DATA:  Patient found on ground post fall. Abrasion right temporal region. Dementia. EXAM: CT HEAD WITHOUT CONTRAST CT MAXILLOFACIAL WITHOUT CONTRAST CT CERVICAL SPINE WITHOUT CONTRAST TECHNIQUE: Multidetector CT imaging of the head, cervical spine, and maxillofacial structures were performed using the standard protocol without intravenous contrast. Multiplanar CT image reconstructions of the cervical spine and maxillofacial structures were also generated. COMPARISON:  None. FINDINGS: CT HEAD FINDINGS Examination demonstrates mild age related atrophic change. Ventricles and cisterns are within normal. There is mild chronic ischemic microvascular disease. There is no mass, mass effect, shift of midline structures or acute hemorrhage. There is no evidence of acute infarction. Hyperostosis frontalis interna. Subtle mucosal membrane thickening over the sphenoid sinus. Remaining bones and soft tissues are unremarkable. CT MAXILLOFACIAL FINDINGS Orbits are normal and symmetric. Minimal focal soft tissue swelling just lateral to the right orbit. Paranasal sinuses are well developed and well aerated. Deviation of the  nasal septum to the right. There subtle mucosal membrane thickening over the medial aspect of the left maxillary sinus as well as over the sphenoid sinuses. Mastoid air cells are clear  no air-fluid levels are present. No evidence of acute facial bone fracture. CT CERVICAL SPINE FINDINGS Vertebral body alignment and heights are normal. There is mild spondylosis throughout the cervical spine. There is disc space narrowing throughout the cervical spine most prominent at the C4-5 and C5-6 levels. Prevertebral soft tissues are within normal. Atlantoaxial articulation is unremarkable other than degenerative changes. There is uncovertebral joint spurring and mild facet arthropathy. Neural foraminal narrowing at several levels of the lower cervical spine. No acute fracture or subluxation. There are a few small nodules over the lung apices with the largest measuring 4 mm. Heterogeneity of the thyroid gland with prominence of the left lobe and several areas of coarse calcification likely goiter. Subtle loss of mid to anterior vertebral body height of T2 likely chronic. IMPRESSION: No acute intracranial findings. Minimal right lateral orbital soft tissue swelling. No fracture. Age related atrophic change and chronic ischemic microvascular disease. No acute facial bone fracture. Subtle chronic sinus inflammatory disease. No acute cervical spine injury. Mild spondylosis of the cervical spinal multilevel disc disease and mild multilevel neural foraminal narrowing. A few small nodules over the lung apices with the largest measuring 4 mm. Recommend noncontrast chest CT on elective basis for complete lung evaluation. Subtle loss of mid to anterior vertebral body height of TT likely chronic. Goiter. Electronically Signed   By: Marin Olp M.D.   On: 06/15/2016 09:09    Scheduled Meds: . cefTRIAXone (ROCEPHIN)  IV  1 g Intravenous Q24H  . haloperidol  0.25 mg Oral Daily  . hydrochlorothiazide  25 mg Oral Daily  . insulin aspart  0-5 Units Subcutaneous QHS  . insulin aspart  0-9 Units Subcutaneous TID WC  . lisinopril  40 mg Oral Daily  . metoprolol tartrate  25 mg Oral Daily  . mupirocin ointment  1  application Topical BID  . traZODone  100 mg Oral QHS   Continuous Infusions: . sodium chloride 75 mL/hr at 06/16/16 0319    Active Problems:   Acute encephalopathy   UTI (lower urinary tract infection)   Fall   Essential hypertension   Diabetes mellitus with complication ([place])   Dementia    Time spent: [facility] Pager [phone]. If 7PM-7AM, please contact night-coverage at [email or web address], password [email or web address] 06/16/2016, 10:21 PM  LOS: 0 days

## 2016-06-17 LAB — GLUCOSE, CAPILLARY
GLUCOSE-CAPILLARY: 150 mg/dL — AB (ref 65–99)
GLUCOSE-CAPILLARY: 174 mg/dL — AB (ref 65–99)
Glucose-Capillary: 203 mg/dL — ABNORMAL HIGH (ref 65–99)
Glucose-Capillary: 164 mg/dL — ABNORMAL HIGH (ref 65–99)

## 2016-06-17 LAB — URINE CULTURE

## 2016-06-17 MED ORDER — CEFDINIR 125 MG/5ML PO SUSR
300.0000 mg | Freq: Two times a day (BID) | ORAL | Status: DC
  Administered 2016-06-18: 300 mg via ORAL
  Filled 2016-06-17: qty 15

## 2016-06-17 NOTE — Evaluation (Signed)
Physical Therapy Evaluation Patient Details Name: Yolanda Kennedy MRN: [id number] DOB: 1928-03-16 Today's Date: 06/17/2016   History of Present Illness  Patient is a 82 y/o female with hx of HTN, HLD, dementia and DM presents after unwitnessed fall at home on her curb. CT-unremarkable. Abrasion right temporal region. Suspected UTI.  Clinical Impression  Patient presents with generalized weakness, pain and impaired balance s/p above. Tolerated gait training and stair training with Min guard-Min A for balance/safety. Good balance demonstrated with use of RW for support. Pt reports not using one at home however not sure of accuracy of reported PLOF as pt with hx of dementia. Will need to talk to aide/friend to determine pt's PLOF and level of supervision at home. Pt not safe to be home alone due to safety concerns. If pt does not have proper supervision, may need to think about ALF vs short term rehab. Will follow acutely to maximize independence and mobility prior to return home.   Follow Up Recommendations Home health PT;Supervision for mobility/OOB    Equipment Recommendations  None recommended by PT    Recommendations for Other Services OT consult     Precautions / Restrictions Precautions Precautions: Fall Restrictions Weight Bearing Restrictions: No      Mobility  Bed Mobility Overal bed mobility: Needs Assistance Bed Mobility: Supine to Sit     Supine to sit: Supervision;HOB elevated     General bed mobility comments: Increased time to get to EOB with use of rail.   Transfers Overall transfer level: Needs assistance Equipment used: Rolling walker (2 wheeled) Transfers: Sit to/from Stand Sit to Stand: Min guard         General transfer comment: Min guard for safety. Stood from [employer]. Transferred to chair post ambulation bout.  Ambulation/Gait Ambulation/Gait assistance: Min assist Ambulation Distance (Feet): 200 Feet Assistive device: Rolling walker (2  wheeled) Gait Pattern/deviations: Step-through pattern;Decreased stride length Gait velocity: decreased Gait velocity interpretation: <1.8 ft/sec, indicative of risk for recurrent falls General Gait Details: Slow, steady gait. Able to safely use RW so wonder if she uses this at home? HR stable.  Stairs Stairs: Yes Stairs assistance: Min assist Stair Management: Alternating pattern;Two rails Number of Stairs: 3 (+ 2 steps x2 bouts) General stair comments: Very slow and difficulty with descending steps requiring Min A.  Wheelchair Mobility    Modified Rankin (Stroke Patients Only) Modified Rankin (Stroke Patients Only) Pre-Morbid Rankin Score: Moderate disability Modified Rankin: Moderately severe disability     Balance Overall balance assessment: Needs assistance Sitting-balance support: Feet supported;No upper extremity supported Sitting balance-Leahy Scale: Fair     Standing balance support: During functional activity Standing balance-Leahy Scale: Poor Standing balance comment: Requires UE support for standing balance.                             Pertinent Vitals/Pain Pain Assessment: Faces Faces Pain Scale: Hurts little more Pain Location: face Pain Descriptors / Indicators: Sore Pain Intervention(s): Monitored during session;Repositioned    Home Living Family/patient expects to be discharged to:: Private residence Living Arrangements: Alone Available Help at Discharge: Personal care attendant;Available PRN/intermittently (.) Type of Home: House Home Access: Stairs to enter   [employer] of Steps: "8 or something"     Additional Comments: Pt not able to provide reliable home setup/PLOF due to hx of dementia.    Prior Function Level of Independence: Needs assistance   Gait / Transfers Assistance Needed: Pt  reports using no DME for ambulation but able to safely use RW during session.  ADL's / Homemaking Assistance Needed: Assist with  bathing per MD notes.   Comments: per MD notes, pt has an aide come in during the day to assist with IADLs and bathing but no one is there at night     Hand Dominance        Extremity/Trunk Assessment   Upper Extremity Assessment: Defer to OT evaluation           Lower Extremity Assessment: Generalized weakness;Difficult to assess due to impaired cognition (Difficult to assess but pt reports "this one feels funny." talking about her RLE on stairs.)         Communication   Communication: No difficulties  Cognition Arousal/Alertness: Awake/alert Behavior During Therapy: WFL for tasks assessed/performed Overall Cognitive Status: History of cognitive impairments - at baseline (Able to state name and knows she is in the hospital but not able to state date. )       Memory: Decreased short-term memory (Not able to remember things talked about within session despite repetition and cues to remember.)              General Comments      Exercises        Assessment/Plan    PT Assessment Patient needs continued PT services  PT Diagnosis Generalized weakness   PT Problem List Decreased strength;Decreased activity tolerance;Decreased balance;Decreased mobility;Decreased cognition;Decreased safety awareness  PT Treatment Interventions Balance training;Gait training;Functional mobility training;Therapeutic activities;Therapeutic exercise;Patient/family education;Stair training   PT Goals (Current goals can be found in the Care Plan section) Acute Rehab PT Goals Patient Stated Goal: none stated PT Goal Formulation: With patient Time For Goal Achievement: 07/01/16 Potential to Achieve Goals: Good    Frequency Min 3X/week   Barriers to discharge Decreased caregiver support lives alone    Co-evaluation               End of Session Equipment Utilized During Treatment: Gait belt Activity Tolerance: Patient tolerated treatment well Patient left: in chair;with call  bell/phone within reach;with chair alarm set Nurse Communication: Mobility status         Time: [id number] PT Time Calculation (min) (ACUTE ONLY): 28 min   Charges:   PT Evaluation $PT Eval Moderate Complexity: 1 Procedure PT Treatments $Gait Training: 8-22 mins   PT G Codes:        Shenandoah Yeats A Kevonte Vanecek 06/17/2016, 10:04 AM Wray Kearns, PT, DPT [phone]

## 2016-06-17 NOTE — Care Management Note (Signed)
Case Management Note  Patient Details  Name: Yolanda Kennedy MRN: [id number] Date of Birth: 02-Dec-1927  Subjective/Objective:                 Spoke withpatient and son at the bedside. Patient lives at home alone in [place]. Patient has a caregiver during the day for 10-12 hours. Son states that family will be providing 24 hour care. They state she has all the DME needed for home, and would like to use [facility] for [facility] PT. Referral made to [facility] for [facility] PT. Awaiting MD orders at DC.    Action/Plan:  Referral made to [facility] for [facility] PT, pt will need orders at DC.  Expected Discharge Date:                  Expected Discharge Plan:  [place]  In-House Referral:     Discharge planning Services  CM Consult  Post Acute Care Choice:  Home Health Choice offered to:  Patient, Adult Children  DME Arranged:  N/A DME Agency:  NA  HH Arranged:  PT [place] Agency:  [place]  Status of Service:  Completed, signed off  If discussed at [place] of Stay Meetings, dates discussed:    Additional Comments:  Carles Collet, RN 06/17/2016, 1:33 PM

## 2016-06-17 NOTE — Progress Notes (Signed)
[facility] PROGRESS NOTE  Yolanda Kennedy [id number] DOB: [birth year] DOA: 06/15/2016 PCP: Criselda Peaches, MD  Assessment/Plan: Acute encephalopathy: Resolved. Patient's dementia has left her alert and oriented to person at baseline but requires daily assistance with ADLs. Suspect this resolved quickly after getting patient out of the heat, fluids, and antibiotics for treatment of UTI. - Med surg admit - Continue Haldol, Ativan, trazodone PRN  Urinary tract infection: UA grossly abnormal though contaminated. Started on ceftriaxone in the ED. Patient is asymptomatic. AF VSS - Ceftriaxone - Urine culture - > K. Pneumonia - sens to cephalosporins will start patient on cefdinir in the morning, monitor patient on new antibiotic discharge  Fall: unwitnessed. Found down on side of street. Hit head. Imaging reasuring - no fracture or ICH. Suspect mechanical. EKG nml.  - Tele - Orthostatics done - Ambulate pt - daily ABX oint for skin abrasion  Hypertension: Fairly normotensive - Continue metoprolol, lisinopril, HCTZ  Diabetes: On oral agents only, held - SSI - A1c   DVT prophylaxis: SCD and regular ambulation  Code Status: FULL - confirmed  Family Communication: Best friend of several years  Disposition Plan: pending improvement  Consults called: none  Admission status: tele obs   Consultants:  n/a  Procedures:  n/a  Antibiotics:  Rocephin 6/28 - > 7/20 (indicate start date, and stop date if known)  HPI/Subjective: No complaints and doing well. No pain. No fever.  Objective: Filed Vitals:   06/17/16 0908 06/17/16 1438  BP: 134/77 151/59  Pulse: 65 65  Temp: 97.8 F (36.6 C) 98.4 F (36.9 C)  Resp: 20 20    Intake/Output Summary (Last 24 hours) at 06/17/16 1933 Last data filed at 06/17/16 1700  Gross per 24 hour  Intake    720 ml  Output      0 ml  Net    720 ml   There were no vitals filed for this visit.  Exam:  General:  No diaphoresis,  anxious, no acute distress Cardiovascular: Regular rate and rhythm no murmurs rubs or gallops Respiratory: Clear to auscultation bilaterally no more breathing Abdomen: Nondistended bowel sounds normal nontender palpation Musculoskeletal: Moving all extremities, no deformity, 5 out of 5 strength   Data Reviewed: Basic Metabolic Panel:  Recent Labs Lab 06/15/16 0754 06/16/16 0324  NA 136 133*  K 3.8 3.6  CL 103 98*  CO2 19* 21*  GLUCOSE 184* 135*  BUN 20 13  CREATININE 1.09* 0.84  CALCIUM 10.1 9.5   Liver Function Tests: No results for input(s): AST, ALT, ALKPHOS, BILITOT, PROT, ALBUMIN in the last 168 hours. No results for input(s): LIPASE, AMYLASE in the last 168 hours. No results for input(s): AMMONIA in the last 168 hours. CBC:  Recent Labs Lab 06/15/16 0754 06/16/16 0324  WBC 11.5* 11.7*  HGB 14.4 13.9  HCT 42.1 39.3  MCV 92.9 90.1  PLT 171 151   Cardiac Enzymes: No results for input(s): CKTOTAL, CKMB, CKMBINDEX, TROPONINI in the last 168 hours. BNP (last 3 results) No results for input(s): BNP in the last 8760 hours.  ProBNP (last 3 results) No results for input(s): PROBNP in the last 8760 hours.  CBG:  Recent Labs Lab 06/16/16 1633 06/16/16 2136 06/17/16 0627 06/17/16 1118 06/17/16 1648  GLUCAP 139* 130* 150* 203* 174*    Recent Results (from the past 240 hour(s))  Urine culture     Status: Abnormal   Collection Time: 06/15/16  9:42 AM  Result Value Ref Range Status  Specimen Description URINE, CATHETERIZED  Final   Special Requests NONE  Final   Culture >=100,000 COLONIES/mL KLEBSIELLA PNEUMONIAE (A)  Final   Report Status 06/17/2016 FINAL  Final   Organism ID, Bacteria KLEBSIELLA PNEUMONIAE (A)  Final      Susceptibility   Klebsiella pneumoniae - MIC*    AMPICILLIN >=32 RESISTANT Resistant     CEFAZOLIN <=4 SENSITIVE Sensitive     CEFTRIAXONE <=1 SENSITIVE Sensitive     CIPROFLOXACIN <=0.25 SENSITIVE Sensitive     GENTAMICIN <=1  SENSITIVE Sensitive     IMIPENEM <=0.25 SENSITIVE Sensitive     NITROFURANTOIN 64 INTERMEDIATE Intermediate     TRIMETH/SULFA <=20 SENSITIVE Sensitive     AMPICILLIN/SULBACTAM 4 SENSITIVE Sensitive     PIP/TAZO <=4 SENSITIVE Sensitive     * >=100,000 COLONIES/mL KLEBSIELLA PNEUMONIAE     Studies: No results found.  Scheduled Meds: . [START ON 06/18/2016] cefdinir  300 mg Oral BID  . haloperidol  0.25 mg Oral Daily  . hydrochlorothiazide  25 mg Oral Daily  . insulin aspart  0-5 Units Subcutaneous QHS  . insulin aspart  0-9 Units Subcutaneous TID WC  . lisinopril  40 mg Oral Daily  . metoprolol tartrate  25 mg Oral Daily  . mupirocin ointment  1 application Topical BID  . traZODone  100 mg Oral QHS   Continuous Infusions:    Active Problems:   Acute encephalopathy   UTI (lower urinary tract infection)   Fall   Essential hypertension   Diabetes mellitus with complication ([place])   Dementia    Time spent: [facility] Pager [phone]. If 7PM-7AM, please contact night-coverage at [email or web address], password [facility] 06/17/2016, 7:33 PM  LOS: 1 day

## 2016-06-17 NOTE — Progress Notes (Signed)
Occupational Therapy Evaluation Patient Details Name: Yolanda Kennedy MRN: [id number] DOB: [birth year] Today's Date: 06/17/2016    History of Present Illness Patient is a 82 y/o female with hx of HTN, HLD, dementia and DM presents after unwitnessed fall at home on her curb. CT-unremarkable. Abrasion right temporal region. Suspected UTI.   Clinical Impression   Pt admitted with the above diagnoses and presents with below problem list. Pt will benefit from continued acute OT to address the below listed deficits and maximize independence with BADLs prior to d/c. PTA pt was needing assist with bathing and IADLs. She ambulated without AD PTA. Currently pt is min guard with functional mobility/transfers. Lenghty discussion with caregivers regarding need for 24 hour assist which they are looking into. If unable to arrange 24 hour care may need to consider ALF vs short-term rehab.      Follow Up Recommendations  Home health OT;Supervision/Assistance - 24 hour    Equipment Recommendations  None recommended by OT    Recommendations for Other Services       Precautions / Restrictions Precautions Precautions: Fall Restrictions Weight Bearing Restrictions: No      Mobility Bed Mobility Overal bed mobility: Needs Assistance Bed Mobility: Supine to Sit     Supine to sit: Supervision;HOB elevated     General bed mobility comments: up in chair  Transfers Overall transfer level: Needs assistance Equipment used: Rolling walker (2 wheeled) Transfers: Sit to/from Stand Sit to Stand: Min guard;Min assist         General transfer comment: min guard from recliner, light min A from regular height toilet in bathroom.     Balance Overall balance assessment: Needs assistance;History of Falls Sitting-balance support: Feet supported;No upper extremity supported Sitting balance-Leahy Scale: Fair     Standing balance support: No upper extremity supported;Bilateral upper extremity  supported Standing balance-Leahy Scale: Fair Standing balance comment: stood at sink to brush teeth with no LOB, occasional single extremity support. Fair static standing balance.                            ADL Overall ADL's : Needs assistance/impaired Eating/Feeding: Set up;Sitting   Grooming: Oral care;Wash/dry hands;Min guard;Standing;Set up Grooming Details (indicate cue type and reason): some verbal cueing for initiation Upper Body Bathing: Maximal assistance;Sitting Upper Body Bathing Details (indicate cue type and reason): baseline assist with bathing Lower Body Bathing: Maximal assistance;Sit to/from stand Lower Body Bathing Details (indicate cue type and reason): baseline assist with bathing Upper Body Dressing : Set up;Sitting;Minimal assistance   Lower Body Dressing: Set up;Minimal assistance;Sit to/from stand   Toilet Transfer: Minimal assistance;Ambulation;Regular Toilet;RW Toilet Transfer Details (indicate cue type and reason): min A for steading assist from low seat surface Toileting- Clothing Manipulation and Hygiene: Minimal assistance;Sit to/from stand;Set up;Min guard   Tub/ Shower Transfer: Walk-in shower;Min guard;Ambulation;Rolling walker;3 in 1   Functional mobility during ADLs: Min guard;Rolling walker General ADL Comments: Pt completed in-room functional mobility, toilet transfer, and grooming tasks as detailed above. Lenghty discussion with caregivers regarding need for 24 hour assist which they are looking into.      Vision     Perception     Praxis      Pertinent Vitals/Pain Pain Assessment: Faces Faces Pain Scale: Hurts little more Pain Location: face Pain Descriptors / Indicators: Sore Pain Intervention(s): Monitored during session     Hand Dominance     Extremity/Trunk Assessment Upper Extremity Assessment Upper Extremity Assessment:  Overall [facility] for tasks assessed;Generalized weakness;Difficult to assess due to impaired  cognition   Lower Extremity Assessment Lower Extremity Assessment: Defer to PT evaluation       Communication Communication Communication: No difficulties   Cognition Arousal/Alertness: Awake/alert Behavior During Therapy: WFL for tasks assessed/performed Overall Cognitive Status: History of cognitive impairments - at baseline       Memory: Decreased short-term memory             General Comments       Exercises       Shoulder Instructions      Home Living Family/patient expects to be discharged to:: Private residence Living Arrangements: Alone Available Help at Discharge: Personal care attendant;Available PRN/intermittently Type of Home: House Home Access: Stairs to enter [employer] of Steps: "8 or something"         Bathroom Shower/Tub: Chief Strategy Officer: Shower seat;Toilet riser   Additional Comments: Pt not able to provide reliable home setup/PLOF due to hx of dementia.      Prior Functioning/Environment Level of Independence: Needs assistance  Gait / Transfers Assistance Needed: Pt reports using no DME for ambulation but able to safely use RW during session. ADL's / Homemaking Assistance Needed: Assist with bathing per MD notes.    Comments: per MD notes, pt has an aide come in during the day to assist with IADLs and bathing but no one is there at night    OT Diagnosis: Generalized weakness;Cognitive deficits   OT Problem List: Impaired balance (sitting and/or standing);Decreased knowledge of use of DME or AE;Decreased knowledge of precautions;Pain   OT Treatment/Interventions: Self-care/ADL training;Energy conservation;DME and/or AE instruction;Therapeutic activities;Cognitive remediation/compensation;Patient/family education;Balance training    OT Goals(Current goals can be found in the care plan section) Acute Rehab OT Goals Patient Stated Goal: none stated OT Goal Formulation: With patient/family Time For  Goal Achievement: 06/24/16 Potential to Achieve Goals: Good ADL Goals Pt Will Perform Upper Body Dressing: with set-up;sitting Pt Will Perform Lower Body Dressing: with min guard assist;sit to/from stand Pt Will Transfer to Toilet: with supervision;ambulating Pt Will Perform Toileting - Clothing Manipulation and hygiene: with supervision;sitting/lateral leans;sit to/from stand Pt Will Perform Tub/Shower Transfer: Shower transfer;with min guard assist;ambulating;shower seat;rolling walker  OT Frequency: Min 2X/week   Barriers to D/C:    family trying to arrange 24 hour care       Co-evaluation              End of Session Equipment Utilized During Treatment: Rolling walker  Activity Tolerance: Patient tolerated treatment well Patient left: in chair;with call bell/phone within reach;with chair alarm set   Time: 1025-1055 OT Time Calculation (min): 30 min Charges:  OT General Charges $OT Visit: 1 Procedure OT Evaluation $OT Eval Low Complexity: 1 Procedure OT Treatments $Self Care/Home Management : 8-22 mins G-Codes:    Hortencia Pilar 2016/07/13, 11:21 AM

## 2016-06-17 NOTE — Care Management Important Message (Signed)
Important Message  Patient Details  Name: Yolanda Kennedy MRN: [id number] Date of Birth: August 28, 1928   Medicare Important Message Given:  Yes    Blimi Godby, Leroy Sea 06/17/2016, 1:34 PM

## 2016-06-18 LAB — GLUCOSE, CAPILLARY
GLUCOSE-CAPILLARY: 152 mg/dL — AB (ref 65–99)
Glucose-Capillary: 200 mg/dL — ABNORMAL HIGH (ref 65–99)

## 2016-06-18 MED ORDER — CEFDINIR 125 MG/5ML PO SUSR: 300.0000 mg | Freq: Two times a day (BID) | ORAL | Status: AC

## 2016-06-18 NOTE — Progress Notes (Signed)
Pt discharged to home via volunteer and family pushing her in a wheelchair

## 2016-06-18 NOTE — Discharge Instructions (Addendum)
Talk to your doctor about when to start ativan and zyrtec again. Urinary Tract Infection Urinary tract infections (UTIs) can develop anywhere along your urinary tract. Your urinary tract is your body's drainage system for removing wastes and extra water. Your urinary tract includes two kidneys, two ureters, a bladder, and a urethra. Your kidneys are a pair of bean-shaped organs. Each kidney is about the size of your fist. They are located below your ribs, one on each side of your spine. CAUSES Infections are caused by microbes, which are microscopic organisms, including fungi, viruses, and bacteria. These organisms are so small that they can only be seen through a microscope. Bacteria are the microbes that most commonly cause UTIs. SYMPTOMS  Symptoms of UTIs may vary by age and gender of the patient and by the location of the infection. Symptoms in young women typically include a frequent and intense urge to urinate and a painful, burning feeling in the bladder or urethra during urination. Older women and men are more likely to be tired, shaky, and weak and have muscle aches and abdominal pain. A fever may mean the infection is in your kidneys. Other symptoms of a kidney infection include pain in your back or sides below the ribs, nausea, and vomiting. DIAGNOSIS To diagnose a UTI, your caregiver will ask you about your symptoms. Your caregiver will also ask you to provide a urine sample. The urine sample will be tested for bacteria and white blood cells. White blood cells are made by your body to help fight infection. TREATMENT  Typically, UTIs can be treated with medication. Because most UTIs are caused by a bacterial infection, they usually can be treated with the use of antibiotics. The choice of antibiotic and length of treatment depend on your symptoms and the type of bacteria causing your infection. HOME CARE INSTRUCTIONS  If you were prescribed antibiotics, take them exactly as your caregiver  instructs you. Finish the medication even if you feel better after you have only taken some of the medication.  Drink enough water and fluids to keep your urine clear or pale yellow.  Avoid caffeine, tea, and carbonated beverages. They tend to irritate your bladder.  Empty your bladder often. Avoid holding urine for long periods of time.  Empty your bladder before and after sexual intercourse.  After a bowel movement, women should cleanse from front to back. Use each tissue only once. SEEK MEDICAL CARE IF:   You have back pain.  You develop a fever.  Your symptoms do not begin to resolve within 3 days. SEEK IMMEDIATE MEDICAL CARE IF:   You have severe back pain or lower abdominal pain.  You develop chills.  You have nausea or vomiting.  You have continued burning or discomfort with urination. MAKE SURE YOU:   Understand these instructions.  Will watch your condition.  Will get help right away if you are not doing well or get worse.   This information is not intended to replace advice given to you by your health care provider. Make sure you discuss any questions you have with your health care provider.   Document Released: 08/25/2005 Document Revised: 08/06/2015 Document Reviewed: 12/24/2011 Elsevier Interactive Patient Education [employer].

## 2016-06-18 NOTE — Care Management Important Message (Signed)
Important Message  Patient Details  Name: Yolanda Kennedy MRN: [id number] Date of Birth: 03/08/1928   Medicare Important Message Given:  Yes    Loann Quill 06/18/2016, 8:23 AM

## 2016-06-21 DIAGNOSIS — Z87891 Personal history of nicotine dependence: Secondary | ICD-10-CM | POA: Diagnosis not present

## 2016-06-21 DIAGNOSIS — Z7984 Long term (current) use of oral hypoglycemic drugs: Secondary | ICD-10-CM | POA: Diagnosis not present

## 2016-06-21 DIAGNOSIS — I10 Essential (primary) hypertension: Secondary | ICD-10-CM | POA: Diagnosis not present

## 2016-06-21 DIAGNOSIS — N390 Urinary tract infection, site not specified: Secondary | ICD-10-CM | POA: Diagnosis not present

## 2016-06-21 DIAGNOSIS — G9349 Other encephalopathy: Secondary | ICD-10-CM | POA: Diagnosis not present

## 2016-06-21 DIAGNOSIS — F0390 Unspecified dementia without behavioral disturbance: Secondary | ICD-10-CM | POA: Diagnosis not present

## 2016-06-21 DIAGNOSIS — E119 Type 2 diabetes mellitus without complications: Secondary | ICD-10-CM | POA: Diagnosis not present

## 2016-06-21 DIAGNOSIS — E785 Hyperlipidemia, unspecified: Secondary | ICD-10-CM | POA: Diagnosis not present

## 2016-06-21 DIAGNOSIS — Z9181 History of falling: Secondary | ICD-10-CM | POA: Diagnosis not present

## 2016-06-21 DIAGNOSIS — S0093XA Contusion of unspecified part of head, initial encounter: Secondary | ICD-10-CM | POA: Diagnosis not present

## 2016-06-29 NOTE — Discharge Summary (Signed)
Physician Discharge Summary  Yolanda Kennedy [id number] DOB: [birth year] DOA: 06/15/2016  PCP: Criselda Peaches, MD  Admit date: 06/15/2016 Discharge date: 06/29/2016  Time spent: 45 minutes  Recommendations for Outpatient Follow-up:  1. PCP f/u on d/c   Discharge Diagnoses:  Active Problems:   Acute encephalopathy   UTI (lower urinary tract infection)   Fall   Essential hypertension   Diabetes mellitus with complication ([place])   Dementia   Discharge Condition: stable  Diet recommendation: regular  There were no vitals filed for this visit.  History of present illness:  Yolanda Kennedy is a 82 y.o. female with medical history significant of HTN, DM, dementia, presenting after patient was found down on the curb confused. Level V caveat applies as patient is demented and unable to provide reliable history. History provided in part by EDP, EMS report, and patient's best friend who is very good knowledge base with regards to patient's daily functioning. Patient lives at home by herself and has an in-home health aide throughout the day who helps with bathing cleaning and cooking. Patient is able to reasonably participate in conversation at baseline but quickly forgets the conversation after having it. Currently patient's mental status is at baseline per patient's friend. At time of event earlier today patient was confused and thought that she was at home and unable to provide any history. Patient is without any focal complaints including chest pain, shortness breath, palpitations, neck stiffness, abdominal pain, diarrhea, dysuria, frequency, back. Patient does have a right facial abrasion which is tender.  Hospital Course:  Rocephin started and AMS resolved.  Urine culture - > K. Pneumonia Sens to cephalosporins will start patient on cefdinir in the morning, monitor patient on new antibiotic discharge Abx ointment to skin abrasions Neg orthostatics   Procedures:  neg (i.e. Studies not  automatically included, echos, thoracentesis, etc; not x-rays)  Consultations:  neg  Discharge Exam: Vitals:   06/18/16 0603 06/18/16 0922  BP: (!) 174/86 (!) 142/64  Pulse: 60 (!) 50  Resp: 16 18  Temp: 97.7 F (36.5 C) (!) 96.6 F (35.9 C)     General:  No diaphoresis, anxious, no acute distress  Cardiovascular: Regular rate and rhythm no murmurs rubs or gallops  Respiratory: Clear to auscultation bilaterally no more breathing  Abdomen: Nondistended bowel sounds normal nontender palpation  Musculoskeletal: Moving all extremities, no deformity, 5 out of 5 strength    Discharge Instructions    Discharge Medication List as of 06/18/2016 12:36 PM    START taking these medications   Details  cefdinir (OMNICEF) 125 MG/5ML suspension Take 12 mLs (300 mg total) by mouth 2 (two) times daily., Starting 06/18/2016, Until Fri 06/25/16, Normal      CONTINUE these medications which have NOT CHANGED   Details  glimepiride (AMARYL) 4 MG tablet Take 4 mg by mouth daily with breakfast., Until Discontinued, Historical Med    haloperidol (HALDOL) 0.5 MG tablet Take 0.25 mg by mouth daily. , Starting 08/31/2015, Until Discontinued, Historical Med    hydrochlorothiazide (HYDRODIURIL) 25 MG tablet Take 25 mg by mouth daily., Until Discontinued, Historical Med    lisinopril (PRINIVIL,ZESTRIL) 40 MG tablet Take 40 mg by mouth daily., Until Discontinued, Historical Med    metFORMIN (GLUCOPHAGE) 500 MG tablet Take 500-1,000 mg by mouth 2 (two) times daily with a meal. 1000mg  in the morning, 500mg  in the evening, Until Discontinued, Historical Med    metoprolol tartrate (LOPRESSOR) 25 MG tablet Take 25 mg by mouth  daily., Until Discontinued, Historical Med    Multiple Vitamin (HEALTHY HAIR/SKIN/NAILS) TABS Take 1 tablet by mouth daily. , Until Discontinued, Historical Med    traZODone (DESYREL) 50 MG tablet Take 100 mg by mouth at bedtime. , Until Discontinued, Historical Med      STOP  taking these medications     cetirizine (ZYRTEC) 10 MG tablet      LORazepam (ATIVAN) 0.5 MG tablet        No Known Allergies Follow-up Information    [facility].   Why:  for home health PT Contact information: [address] [place] [address] [phone]            The results of significant diagnostics from this hospitalization (including imaging, microbiology, ancillary and laboratory) are listed below for reference.    Significant Diagnostic Studies: Ct Head Wo Contrast  Result Date: 06/15/2016 CLINICAL DATA:  Patient found on ground post fall. Abrasion right temporal region. Dementia. EXAM: CT HEAD WITHOUT CONTRAST CT MAXILLOFACIAL WITHOUT CONTRAST CT CERVICAL SPINE WITHOUT CONTRAST TECHNIQUE: Multidetector CT imaging of the head, cervical spine, and maxillofacial structures were performed using the standard protocol without intravenous contrast. Multiplanar CT image reconstructions of the cervical spine and maxillofacial structures were also generated. COMPARISON:  None. FINDINGS: CT HEAD FINDINGS Examination demonstrates mild age related atrophic change. Ventricles and cisterns are within normal. There is mild chronic ischemic microvascular disease. There is no mass, mass effect, shift of midline structures or acute hemorrhage. There is no evidence of acute infarction. Hyperostosis frontalis interna. Subtle mucosal membrane thickening over the sphenoid sinus. Remaining bones and soft tissues are unremarkable. CT MAXILLOFACIAL FINDINGS Orbits are normal and symmetric. Minimal focal soft tissue swelling just lateral to the right orbit. Paranasal sinuses are well developed and well aerated. Deviation of the nasal septum to the right. There subtle mucosal membrane thickening over the medial aspect of the left maxillary sinus as well as over the sphenoid sinuses. Mastoid air cells are clear no air-fluid levels are present. No evidence of acute facial bone  fracture. CT CERVICAL SPINE FINDINGS Vertebral body alignment and heights are normal. There is mild spondylosis throughout the cervical spine. There is disc space narrowing throughout the cervical spine most prominent at the C4-5 and C5-6 levels. Prevertebral soft tissues are within normal. Atlantoaxial articulation is unremarkable other than degenerative changes. There is uncovertebral joint spurring and mild facet arthropathy. Neural foraminal narrowing at several levels of the lower cervical spine. No acute fracture or subluxation. There are a few small nodules over the lung apices with the largest measuring 4 mm. Heterogeneity of the thyroid gland with prominence of the left lobe and several areas of coarse calcification likely goiter. Subtle loss of mid to anterior vertebral body height of T2 likely chronic. IMPRESSION: No acute intracranial findings. Minimal right lateral orbital soft tissue swelling. No fracture. Age related atrophic change and chronic ischemic microvascular disease. No acute facial bone fracture. Subtle chronic sinus inflammatory disease. No acute cervical spine injury. Mild spondylosis of the cervical spinal multilevel disc disease and mild multilevel neural foraminal narrowing. A few small nodules over the lung apices with the largest measuring 4 mm. Recommend noncontrast chest CT on elective basis for complete lung evaluation. Subtle loss of mid to anterior vertebral body height of TT likely chronic. Goiter. Electronically Signed   By: Marin Olp M.D.   On: 06/15/2016 09:09   Ct Cervical Spine Wo Contrast  Result Date: 06/15/2016 CLINICAL DATA:  Patient  found on ground post fall. Abrasion right temporal region. Dementia. EXAM: CT HEAD WITHOUT CONTRAST CT MAXILLOFACIAL WITHOUT CONTRAST CT CERVICAL SPINE WITHOUT CONTRAST TECHNIQUE: Multidetector CT imaging of the head, cervical spine, and maxillofacial structures were performed using the standard protocol without intravenous  contrast. Multiplanar CT image reconstructions of the cervical spine and maxillofacial structures were also generated. COMPARISON:  None. FINDINGS: CT HEAD FINDINGS Examination demonstrates mild age related atrophic change. Ventricles and cisterns are within normal. There is mild chronic ischemic microvascular disease. There is no mass, mass effect, shift of midline structures or acute hemorrhage. There is no evidence of acute infarction. Hyperostosis frontalis interna. Subtle mucosal membrane thickening over the sphenoid sinus. Remaining bones and soft tissues are unremarkable. CT MAXILLOFACIAL FINDINGS Orbits are normal and symmetric. Minimal focal soft tissue swelling just lateral to the right orbit. Paranasal sinuses are well developed and well aerated. Deviation of the nasal septum to the right. There subtle mucosal membrane thickening over the medial aspect of the left maxillary sinus as well as over the sphenoid sinuses. Mastoid air cells are clear no air-fluid levels are present. No evidence of acute facial bone fracture. CT CERVICAL SPINE FINDINGS Vertebral body alignment and heights are normal. There is mild spondylosis throughout the cervical spine. There is disc space narrowing throughout the cervical spine most prominent at the C4-5 and C5-6 levels. Prevertebral soft tissues are within normal. Atlantoaxial articulation is unremarkable other than degenerative changes. There is uncovertebral joint spurring and mild facet arthropathy. Neural foraminal narrowing at several levels of the lower cervical spine. No acute fracture or subluxation. There are a few small nodules over the lung apices with the largest measuring 4 mm. Heterogeneity of the thyroid gland with prominence of the left lobe and several areas of coarse calcification likely goiter. Subtle loss of mid to anterior vertebral body height of T2 likely chronic. IMPRESSION: No acute intracranial findings. Minimal right lateral orbital soft tissue  swelling. No fracture. Age related atrophic change and chronic ischemic microvascular disease. No acute facial bone fracture. Subtle chronic sinus inflammatory disease. No acute cervical spine injury. Mild spondylosis of the cervical spinal multilevel disc disease and mild multilevel neural foraminal narrowing. A few small nodules over the lung apices with the largest measuring 4 mm. Recommend noncontrast chest CT on elective basis for complete lung evaluation. Subtle loss of mid to anterior vertebral body height of TT likely chronic. Goiter. Electronically Signed   By: Marin Olp M.D.   On: 06/15/2016 09:09   Dg Humerus Left  Result Date: 06/15/2016 CLINICAL DATA:  Pain over both upper arms with difficulty with abduction. EXAM: LEFT HUMERUS - 2+ VIEW COMPARISON:  Left shoulder 05/04/2016 FINDINGS: Exam demonstrates diffuse decreased bone mineralization. There are minimal degenerative changes of the [facility] joint. There is no acute fracture or dislocation. Remainder of the exam is unremarkable. IMPRESSION: No acute findings. Mild degenerate change of the [facility] joint. Electronically Signed   By: Marin Olp M.D.   On: 06/15/2016 08:29   Dg Humerus Right  Result Date: 06/15/2016 CLINICAL DATA:  Altered mental status with complaints of bilateral upper arm pain and difficulty with abduction. EXAM: RIGHT HUMERUS - 2+ VIEW COMPARISON:  None. FINDINGS: Mild degenerative change over the [facility] joint. No evidence of acute fracture or dislocation. Mild diffuse decreased bone mineralization. IMPRESSION: No acute findings. Mild degenerate change of the [facility] joint. Electronically Signed   By: Marin Olp M.D.   On: 06/15/2016 08:09   Ct Maxillofacial Wo Cm  Result Date: 06/15/2016 CLINICAL DATA:  Patient found on ground post fall. Abrasion right temporal region. Dementia. EXAM: CT HEAD WITHOUT CONTRAST CT MAXILLOFACIAL WITHOUT CONTRAST CT CERVICAL SPINE WITHOUT CONTRAST TECHNIQUE: Multidetector CT imaging of the head,  cervical spine, and maxillofacial structures were performed using the standard protocol without intravenous contrast. Multiplanar CT image reconstructions of the cervical spine and maxillofacial structures were also generated. COMPARISON:  None. FINDINGS: CT HEAD FINDINGS Examination demonstrates mild age related atrophic change. Ventricles and cisterns are within normal. There is mild chronic ischemic microvascular disease. There is no mass, mass effect, shift of midline structures or acute hemorrhage. There is no evidence of acute infarction. Hyperostosis frontalis interna. Subtle mucosal membrane thickening over the sphenoid sinus. Remaining bones and soft tissues are unremarkable. CT MAXILLOFACIAL FINDINGS Orbits are normal and symmetric. Minimal focal soft tissue swelling just lateral to the right orbit. Paranasal sinuses are well developed and well aerated. Deviation of the nasal septum to the right. There subtle mucosal membrane thickening over the medial aspect of the left maxillary sinus as well as over the sphenoid sinuses. Mastoid air cells are clear no air-fluid levels are present. No evidence of acute facial bone fracture. CT CERVICAL SPINE FINDINGS Vertebral body alignment and heights are normal. There is mild spondylosis throughout the cervical spine. There is disc space narrowing throughout the cervical spine most prominent at the C4-5 and C5-6 levels. Prevertebral soft tissues are within normal. Atlantoaxial articulation is unremarkable other than degenerative changes. There is uncovertebral joint spurring and mild facet arthropathy. Neural foraminal narrowing at several levels of the lower cervical spine. No acute fracture or subluxation. There are a few small nodules over the lung apices with the largest measuring 4 mm. Heterogeneity of the thyroid gland with prominence of the left lobe and several areas of coarse calcification likely goiter. Subtle loss of mid to anterior vertebral body height of  T2 likely chronic. IMPRESSION: No acute intracranial findings. Minimal right lateral orbital soft tissue swelling. No fracture. Age related atrophic change and chronic ischemic microvascular disease. No acute facial bone fracture. Subtle chronic sinus inflammatory disease. No acute cervical spine injury. Mild spondylosis of the cervical spinal multilevel disc disease and mild multilevel neural foraminal narrowing. A few small nodules over the lung apices with the largest measuring 4 mm. Recommend noncontrast chest CT on elective basis for complete lung evaluation. Subtle loss of mid to anterior vertebral body height of TT likely chronic. Goiter. Electronically Signed   By: Marin Olp M.D.   On: 06/15/2016 09:09    Microbiology: No results found for this or any previous visit (from the past 240 hour(s)).   Labs: Basic Metabolic Panel: No results for input(s): NA, K, CL, CO2, GLUCOSE, BUN, CREATININE, CALCIUM, MG, PHOS in the last 168 hours. Liver Function Tests: No results for input(s): AST, ALT, ALKPHOS, BILITOT, PROT, ALBUMIN in the last 168 hours. No results for input(s): LIPASE, AMYLASE in the last 168 hours. No results for input(s): AMMONIA in the last 168 hours. CBC: No results for input(s): WBC, NEUTROABS, HGB, HCT, MCV, PLT in the last 168 hours. Cardiac Enzymes: No results for input(s): CKTOTAL, CKMB, CKMBINDEX, TROPONINI in the last 168 hours. BNP: BNP (last 3 results) No results for input(s): BNP in the last 8760 hours.  ProBNP (last 3 results) No results for input(s): PROBNP in the last 8760 hours.  CBG: No results for input(s): GLUCAP in the last 168 hours.     Signed:  Elwin Mocha MD  [facility] 06/29/2016, 5:48 PM

## 2016-07-16 DIAGNOSIS — M6281 Muscle weakness (generalized): Secondary | ICD-10-CM | POA: Diagnosis not present

## 2016-07-21 ENCOUNTER — Ambulatory Visit: Payer: Medicare Other | Admitting: Podiatry

## 2016-07-30 DIAGNOSIS — E119 Type 2 diabetes mellitus without complications: Secondary | ICD-10-CM | POA: Diagnosis not present

## 2016-07-30 DIAGNOSIS — F0390 Unspecified dementia without behavioral disturbance: Secondary | ICD-10-CM | POA: Diagnosis not present

## 2016-07-30 DIAGNOSIS — J309 Allergic rhinitis, unspecified: Secondary | ICD-10-CM | POA: Diagnosis not present

## 2016-07-30 DIAGNOSIS — I10 Essential (primary) hypertension: Secondary | ICD-10-CM | POA: Diagnosis not present

## 2016-07-30 DIAGNOSIS — G4700 Insomnia, unspecified: Secondary | ICD-10-CM | POA: Diagnosis not present

## 2016-08-04 ENCOUNTER — Ambulatory Visit: Payer: Medicare Other | Admitting: Podiatry

## 2016-08-06 DIAGNOSIS — R2681 Unsteadiness on feet: Secondary | ICD-10-CM | POA: Diagnosis not present

## 2016-08-06 DIAGNOSIS — M6281 Muscle weakness (generalized): Secondary | ICD-10-CM | POA: Diagnosis not present

## 2016-08-09 ENCOUNTER — Ambulatory Visit: Payer: Medicare Other | Admitting: Podiatry

## 2016-08-12 DIAGNOSIS — M119 Crystal arthropathy, unspecified: Secondary | ICD-10-CM | POA: Diagnosis not present

## 2016-08-12 DIAGNOSIS — R279 Unspecified lack of coordination: Secondary | ICD-10-CM | POA: Diagnosis not present

## 2016-08-12 DIAGNOSIS — E119 Type 2 diabetes mellitus without complications: Secondary | ICD-10-CM | POA: Diagnosis not present

## 2016-08-12 DIAGNOSIS — F0390 Unspecified dementia without behavioral disturbance: Secondary | ICD-10-CM | POA: Diagnosis not present

## 2016-08-12 DIAGNOSIS — M6281 Muscle weakness (generalized): Secondary | ICD-10-CM | POA: Diagnosis not present

## 2016-08-12 DIAGNOSIS — K7581 Nonalcoholic steatohepatitis (NASH): Secondary | ICD-10-CM | POA: Diagnosis not present

## 2016-08-13 DIAGNOSIS — R197 Diarrhea, unspecified: Secondary | ICD-10-CM | POA: Diagnosis not present

## 2016-08-13 DIAGNOSIS — R600 Localized edema: Secondary | ICD-10-CM | POA: Diagnosis not present

## 2016-08-16 ENCOUNTER — Ambulatory Visit: Payer: Medicare Other | Admitting: Podiatry

## 2016-08-19 DIAGNOSIS — E119 Type 2 diabetes mellitus without complications: Secondary | ICD-10-CM | POA: Diagnosis not present

## 2016-08-19 DIAGNOSIS — I10 Essential (primary) hypertension: Secondary | ICD-10-CM | POA: Diagnosis not present

## 2016-08-19 DIAGNOSIS — Z0000 Encounter for general adult medical examination without abnormal findings: Secondary | ICD-10-CM | POA: Diagnosis not present

## 2016-09-10 DIAGNOSIS — R600 Localized edema: Secondary | ICD-10-CM | POA: Diagnosis not present

## 2016-09-17 DIAGNOSIS — F0390 Unspecified dementia without behavioral disturbance: Secondary | ICD-10-CM | POA: Diagnosis not present

## 2016-09-17 DIAGNOSIS — J309 Allergic rhinitis, unspecified: Secondary | ICD-10-CM | POA: Diagnosis not present

## 2016-09-17 DIAGNOSIS — I10 Essential (primary) hypertension: Secondary | ICD-10-CM | POA: Diagnosis not present

## 2016-09-17 DIAGNOSIS — R300 Dysuria: Secondary | ICD-10-CM | POA: Diagnosis not present

## 2016-09-17 DIAGNOSIS — G4700 Insomnia, unspecified: Secondary | ICD-10-CM | POA: Diagnosis not present

## 2016-09-17 DIAGNOSIS — E119 Type 2 diabetes mellitus without complications: Secondary | ICD-10-CM | POA: Diagnosis not present

## 2016-09-20 DIAGNOSIS — R300 Dysuria: Secondary | ICD-10-CM | POA: Diagnosis not present

## 2016-09-23 DIAGNOSIS — Z23 Encounter for immunization: Secondary | ICD-10-CM | POA: Diagnosis not present

## 2016-09-24 DIAGNOSIS — N390 Urinary tract infection, site not specified: Secondary | ICD-10-CM | POA: Diagnosis not present

## 2016-10-13 DIAGNOSIS — G4700 Insomnia, unspecified: Secondary | ICD-10-CM | POA: Diagnosis not present

## 2016-10-13 DIAGNOSIS — I10 Essential (primary) hypertension: Secondary | ICD-10-CM | POA: Diagnosis not present

## 2016-10-13 DIAGNOSIS — J309 Allergic rhinitis, unspecified: Secondary | ICD-10-CM | POA: Diagnosis not present

## 2016-10-13 DIAGNOSIS — F0390 Unspecified dementia without behavioral disturbance: Secondary | ICD-10-CM | POA: Diagnosis not present

## 2016-10-13 DIAGNOSIS — E119 Type 2 diabetes mellitus without complications: Secondary | ICD-10-CM | POA: Diagnosis not present

## 2016-10-13 DIAGNOSIS — R300 Dysuria: Secondary | ICD-10-CM | POA: Diagnosis not present

## 2016-10-15 DIAGNOSIS — G4700 Insomnia, unspecified: Secondary | ICD-10-CM | POA: Diagnosis not present

## 2016-10-15 DIAGNOSIS — F0390 Unspecified dementia without behavioral disturbance: Secondary | ICD-10-CM | POA: Diagnosis not present

## 2016-10-15 DIAGNOSIS — E119 Type 2 diabetes mellitus without complications: Secondary | ICD-10-CM | POA: Diagnosis not present

## 2016-10-15 DIAGNOSIS — R635 Abnormal weight gain: Secondary | ICD-10-CM | POA: Diagnosis not present

## 2016-10-15 DIAGNOSIS — J309 Allergic rhinitis, unspecified: Secondary | ICD-10-CM | POA: Diagnosis not present

## 2016-10-15 DIAGNOSIS — G894 Chronic pain syndrome: Secondary | ICD-10-CM | POA: Diagnosis not present

## 2016-10-15 DIAGNOSIS — I10 Essential (primary) hypertension: Secondary | ICD-10-CM | POA: Diagnosis not present

## 2016-11-12 DIAGNOSIS — R635 Abnormal weight gain: Secondary | ICD-10-CM | POA: Diagnosis not present

## 2016-11-12 DIAGNOSIS — G4700 Insomnia, unspecified: Secondary | ICD-10-CM | POA: Diagnosis not present

## 2016-11-12 DIAGNOSIS — E119 Type 2 diabetes mellitus without complications: Secondary | ICD-10-CM | POA: Diagnosis not present

## 2016-11-12 DIAGNOSIS — I10 Essential (primary) hypertension: Secondary | ICD-10-CM | POA: Diagnosis not present

## 2016-11-12 DIAGNOSIS — G894 Chronic pain syndrome: Secondary | ICD-10-CM | POA: Diagnosis not present

## 2016-11-12 DIAGNOSIS — J309 Allergic rhinitis, unspecified: Secondary | ICD-10-CM | POA: Diagnosis not present

## 2016-11-12 DIAGNOSIS — F0390 Unspecified dementia without behavioral disturbance: Secondary | ICD-10-CM | POA: Diagnosis not present

## 2016-11-12 DIAGNOSIS — Z0000 Encounter for general adult medical examination without abnormal findings: Secondary | ICD-10-CM | POA: Diagnosis not present

## 2016-12-03 DIAGNOSIS — R296 Repeated falls: Secondary | ICD-10-CM | POA: Diagnosis not present

## 2016-12-03 DIAGNOSIS — G894 Chronic pain syndrome: Secondary | ICD-10-CM | POA: Diagnosis not present

## 2016-12-09 DIAGNOSIS — Z0000 Encounter for general adult medical examination without abnormal findings: Secondary | ICD-10-CM | POA: Diagnosis not present

## 2016-12-09 DIAGNOSIS — I10 Essential (primary) hypertension: Secondary | ICD-10-CM | POA: Diagnosis not present

## 2016-12-09 DIAGNOSIS — R635 Abnormal weight gain: Secondary | ICD-10-CM | POA: Diagnosis not present

## 2016-12-09 DIAGNOSIS — E119 Type 2 diabetes mellitus without complications: Secondary | ICD-10-CM | POA: Diagnosis not present

## 2016-12-29 DIAGNOSIS — R197 Diarrhea, unspecified: Secondary | ICD-10-CM | POA: Diagnosis not present

## 2016-12-29 DIAGNOSIS — J309 Allergic rhinitis, unspecified: Secondary | ICD-10-CM | POA: Diagnosis not present

## 2016-12-29 DIAGNOSIS — I10 Essential (primary) hypertension: Secondary | ICD-10-CM | POA: Diagnosis not present

## 2016-12-29 DIAGNOSIS — E119 Type 2 diabetes mellitus without complications: Secondary | ICD-10-CM | POA: Diagnosis not present

## 2016-12-29 DIAGNOSIS — F0390 Unspecified dementia without behavioral disturbance: Secondary | ICD-10-CM | POA: Diagnosis not present

## 2016-12-29 DIAGNOSIS — G894 Chronic pain syndrome: Secondary | ICD-10-CM | POA: Diagnosis not present

## 2016-12-29 DIAGNOSIS — G4700 Insomnia, unspecified: Secondary | ICD-10-CM | POA: Diagnosis not present

## 2016-12-31 DIAGNOSIS — E119 Type 2 diabetes mellitus without complications: Secondary | ICD-10-CM | POA: Diagnosis not present

## 2016-12-31 DIAGNOSIS — I10 Essential (primary) hypertension: Secondary | ICD-10-CM | POA: Diagnosis not present

## 2016-12-31 DIAGNOSIS — R296 Repeated falls: Secondary | ICD-10-CM | POA: Diagnosis not present

## 2016-12-31 DIAGNOSIS — J309 Allergic rhinitis, unspecified: Secondary | ICD-10-CM | POA: Diagnosis not present

## 2017-01-07 DIAGNOSIS — G894 Chronic pain syndrome: Secondary | ICD-10-CM | POA: Diagnosis not present

## 2017-01-07 DIAGNOSIS — M1990 Unspecified osteoarthritis, unspecified site: Secondary | ICD-10-CM | POA: Diagnosis not present

## 2017-01-28 DIAGNOSIS — E119 Type 2 diabetes mellitus without complications: Secondary | ICD-10-CM | POA: Diagnosis not present

## 2017-01-28 DIAGNOSIS — J309 Allergic rhinitis, unspecified: Secondary | ICD-10-CM | POA: Diagnosis not present

## 2017-01-28 DIAGNOSIS — L710 Perioral dermatitis: Secondary | ICD-10-CM | POA: Diagnosis not present

## 2017-01-28 DIAGNOSIS — I10 Essential (primary) hypertension: Secondary | ICD-10-CM | POA: Diagnosis not present

## 2017-02-17 DIAGNOSIS — M1990 Unspecified osteoarthritis, unspecified site: Secondary | ICD-10-CM | POA: Diagnosis not present

## 2017-02-17 DIAGNOSIS — R600 Localized edema: Secondary | ICD-10-CM | POA: Diagnosis not present

## 2017-02-17 DIAGNOSIS — G894 Chronic pain syndrome: Secondary | ICD-10-CM | POA: Diagnosis not present

## 2017-03-03 DIAGNOSIS — E119 Type 2 diabetes mellitus without complications: Secondary | ICD-10-CM | POA: Diagnosis not present

## 2017-03-03 DIAGNOSIS — R635 Abnormal weight gain: Secondary | ICD-10-CM | POA: Diagnosis not present

## 2017-03-03 DIAGNOSIS — J309 Allergic rhinitis, unspecified: Secondary | ICD-10-CM | POA: Diagnosis not present

## 2017-03-03 DIAGNOSIS — I10 Essential (primary) hypertension: Secondary | ICD-10-CM | POA: Diagnosis not present

## 2017-03-17 DIAGNOSIS — G894 Chronic pain syndrome: Secondary | ICD-10-CM | POA: Diagnosis not present

## 2017-03-17 DIAGNOSIS — M1990 Unspecified osteoarthritis, unspecified site: Secondary | ICD-10-CM | POA: Diagnosis not present

## 2017-03-24 DIAGNOSIS — Z79899 Other long term (current) drug therapy: Secondary | ICD-10-CM | POA: Diagnosis not present

## 2017-03-25 DIAGNOSIS — D531 Other megaloblastic anemias, not elsewhere classified: Secondary | ICD-10-CM | POA: Diagnosis not present

## 2017-03-29 DIAGNOSIS — R21 Rash and other nonspecific skin eruption: Secondary | ICD-10-CM | POA: Diagnosis not present

## 2017-05-04 DIAGNOSIS — G4700 Insomnia, unspecified: Secondary | ICD-10-CM | POA: Diagnosis not present

## 2017-05-04 DIAGNOSIS — F0390 Unspecified dementia without behavioral disturbance: Secondary | ICD-10-CM | POA: Diagnosis not present

## 2017-05-19 DIAGNOSIS — I10 Essential (primary) hypertension: Secondary | ICD-10-CM | POA: Diagnosis not present

## 2017-05-19 DIAGNOSIS — J309 Allergic rhinitis, unspecified: Secondary | ICD-10-CM | POA: Diagnosis not present

## 2017-05-19 DIAGNOSIS — E119 Type 2 diabetes mellitus without complications: Secondary | ICD-10-CM | POA: Diagnosis not present

## 2017-05-19 DIAGNOSIS — R635 Abnormal weight gain: Secondary | ICD-10-CM | POA: Diagnosis not present

## 2017-06-01 DIAGNOSIS — G4700 Insomnia, unspecified: Secondary | ICD-10-CM | POA: Diagnosis not present

## 2017-06-01 DIAGNOSIS — F0390 Unspecified dementia without behavioral disturbance: Secondary | ICD-10-CM | POA: Diagnosis not present

## 2017-06-23 DIAGNOSIS — J309 Allergic rhinitis, unspecified: Secondary | ICD-10-CM | POA: Diagnosis not present

## 2017-06-23 DIAGNOSIS — E119 Type 2 diabetes mellitus without complications: Secondary | ICD-10-CM | POA: Diagnosis not present

## 2017-06-23 DIAGNOSIS — R296 Repeated falls: Secondary | ICD-10-CM | POA: Diagnosis not present

## 2017-06-23 DIAGNOSIS — I10 Essential (primary) hypertension: Secondary | ICD-10-CM | POA: Diagnosis not present

## 2017-06-29 DIAGNOSIS — F0390 Unspecified dementia without behavioral disturbance: Secondary | ICD-10-CM | POA: Diagnosis not present

## 2017-06-29 DIAGNOSIS — G4700 Insomnia, unspecified: Secondary | ICD-10-CM | POA: Diagnosis not present

## 2017-07-11 DIAGNOSIS — Z79899 Other long term (current) drug therapy: Secondary | ICD-10-CM | POA: Diagnosis not present

## 2017-07-28 DIAGNOSIS — E559 Vitamin D deficiency, unspecified: Secondary | ICD-10-CM | POA: Diagnosis not present

## 2017-08-18 DIAGNOSIS — Z79899 Other long term (current) drug therapy: Secondary | ICD-10-CM | POA: Diagnosis not present

## 2017-09-15 ENCOUNTER — Emergency Department (HOSPITAL_COMMUNITY)
Admission: EM | Admit: 2017-09-15 | Discharge: 2017-09-15 | Disposition: A | Discharge status: Home / Self Care | Payer: Medicare Other | Attending: Emergency Medicine | Admitting: Emergency Medicine

## 2017-09-15 ENCOUNTER — Emergency Department (HOSPITAL_COMMUNITY): Payer: Medicare Other

## 2017-09-15 ENCOUNTER — Encounter (HOSPITAL_COMMUNITY): Payer: Self-pay | Admitting: Emergency Medicine

## 2017-09-15 DIAGNOSIS — I10 Essential (primary) hypertension: Secondary | ICD-10-CM | POA: Insufficient documentation

## 2017-09-15 DIAGNOSIS — Y998 Other external cause status: Secondary | ICD-10-CM | POA: Diagnosis not present

## 2017-09-15 DIAGNOSIS — S0990XA Unspecified injury of head, initial encounter: Secondary | ICD-10-CM

## 2017-09-15 DIAGNOSIS — E119 Type 2 diabetes mellitus without complications: Secondary | ICD-10-CM | POA: Insufficient documentation

## 2017-09-15 DIAGNOSIS — Z87891 Personal history of nicotine dependence: Secondary | ICD-10-CM | POA: Diagnosis not present

## 2017-09-15 DIAGNOSIS — S0181XA Laceration without foreign body of other part of head, initial encounter: Secondary | ICD-10-CM | POA: Diagnosis not present

## 2017-09-15 DIAGNOSIS — E785 Hyperlipidemia, unspecified: Secondary | ICD-10-CM | POA: Insufficient documentation

## 2017-09-15 DIAGNOSIS — Z79899 Other long term (current) drug therapy: Secondary | ICD-10-CM | POA: Insufficient documentation

## 2017-09-15 DIAGNOSIS — S199XXA Unspecified injury of neck, initial encounter: Secondary | ICD-10-CM | POA: Diagnosis not present

## 2017-09-15 DIAGNOSIS — Y92129 Unspecified place in nursing home as the place of occurrence of the external cause: Secondary | ICD-10-CM | POA: Diagnosis not present

## 2017-09-15 DIAGNOSIS — Y9301 Activity, walking, marching and hiking: Secondary | ICD-10-CM | POA: Insufficient documentation

## 2017-09-15 DIAGNOSIS — S098XXA Other specified injuries of head, initial encounter: Secondary | ICD-10-CM | POA: Diagnosis not present

## 2017-09-15 DIAGNOSIS — Z7984 Long term (current) use of oral hypoglycemic drugs: Secondary | ICD-10-CM | POA: Diagnosis not present

## 2017-09-15 DIAGNOSIS — W19XXXA Unspecified fall, initial encounter: Secondary | ICD-10-CM

## 2017-09-15 DIAGNOSIS — S8992XA Unspecified injury of left lower leg, initial encounter: Secondary | ICD-10-CM | POA: Diagnosis not present

## 2017-09-15 DIAGNOSIS — F0390 Unspecified dementia without behavioral disturbance: Secondary | ICD-10-CM | POA: Insufficient documentation

## 2017-09-15 DIAGNOSIS — W01198A Fall on same level from slipping, tripping and stumbling with subsequent striking against other object, initial encounter: Secondary | ICD-10-CM | POA: Diagnosis not present

## 2017-09-15 NOTE — ED Notes (Signed)
Pt ambulatory with one person assist. Pt denies any pain, dizziness or lightheadedness.

## 2017-09-15 NOTE — ED Notes (Signed)
Report given to [facility] staff member [place].

## 2017-09-15 NOTE — ED Notes (Signed)
Pt's family member Joelene Millin updated on pt's results and discharge back to facility.

## 2017-09-15 NOTE — Discharge Instructions (Signed)
It was my pleasure taking care of you today!  Fortunately your imaging today was reassuring.   Return to ER for new or worsening symptoms, any additional concerns.

## 2017-09-15 NOTE — ED Notes (Signed)
Patient transported to CT 

## 2017-09-15 NOTE — ED Triage Notes (Signed)
Per EMS, pt from [facility] facility. Pt had a witnessed fall. Pt lost her balance walking backwards, pt hit her head on the wall. Denies head, neck or back pain. Pt reports left knee pain.   Hematoma on the left back of head. No LOC and no blood thinners. Hx dementia. EMS VS 144/78, P 80s, R 16, 97% RA, CBG 122.

## 2017-09-15 NOTE — ED Provider Notes (Signed)
[facility] EMERGENCY DEPARTMENT Provider Note   CSN: [id number] Arrival date & time: 09/15/17  2046     History   Chief Complaint Chief Complaint  Patient presents with  . Fall    HPI Yolanda Kennedy is a 83 y.o. female.  The history is provided by the patient and medical records. No language interpreter was used.   Yolanda Kennedy is a 83 y.o. female  with a PMH of dementia, HTN, HLD who presents to the Emergency Department for evaluation following witnessed fall at [place]. Per EMS, patient lost her balance while walking and fell backwards, striking her head against the wall. No LOC. Patient not on anticoagulants. Patient also endorses falling and without complaints at this time.   Level V caveat applies 2/2 dementia  Past Medical History:  Diagnosis Date  . Dementia   . Diabetes mellitus without complication ([place])   . Hyperlipidemia   . Hypertension     Patient Active Problem List   Diagnosis Date Noted  . Acute encephalopathy 06/15/2016  . UTI (lower urinary tract infection) 06/15/2016  . Fall 06/15/2016  . Essential hypertension 06/15/2016  . Diabetes mellitus with complication ([place]) 71/04/2693  . Dementia 06/15/2016  . Facial contusion     Past Surgical History:  Procedure Laterality Date  . CHOLECYSTECTOMY      OB History    No data available       Home Medications    Prior to Admission medications   Medication Sig Start Date End Date Taking? Authorizing Provider  glimepiride (AMARYL) 4 MG tablet Take 4 mg by mouth daily with breakfast.    [provider]  haloperidol (HALDOL) 0.5 MG tablet Take 0.25 mg by mouth daily.  08/31/15   [provider]  hydrochlorothiazide (HYDRODIURIL) 25 MG tablet Take 25 mg by mouth daily.    [provider]  lisinopril (PRINIVIL,ZESTRIL) 40 MG tablet Take 40 mg by mouth daily.    [provider]  LORazepam (ATIVAN) 0.5 MG tablet Take 0.5 mg by mouth at  bedtime.    [provider]  metFORMIN (GLUCOPHAGE) 500 MG tablet Take 500-1,000 mg by mouth 2 (two) times daily with a meal. 1000mg  in the morning, 500mg  in the evening    [provider]  metoprolol tartrate (LOPRESSOR) 25 MG tablet Take 25 mg by mouth daily.    [provider]  Multiple Vitamin (HEALTHY HAIR/SKIN/NAILS) TABS Take 1 tablet by mouth daily.     [provider]  traZODone (DESYREL) 50 MG tablet Take 100 mg by mouth at bedtime.     [provider]    Family History Family History  Problem Relation Age of Onset  . Family history unknown: Yes    Social History Social History  Substance Use Topics  . Smoking status: Former Research scientist (life sciences)  . Smokeless tobacco: Former Systems developer    Types: Chew  . Alcohol use No     Allergies   Patient has no known allergies.   Review of Systems Review of Systems  Unable to perform ROS: Dementia     Physical Exam Updated Vital Signs BP (!) 195/66   Pulse 66   Temp 97.8 F (36.6 C) (Oral)   Resp 18   Ht 5' (1.524 m)   Wt 63.5 kg (140 lb)   SpO2 97%   BMI 27.34 kg/m   Physical Exam  Constitutional: She is oriented to person, place, and time. She appears well-developed and  well-nourished. No distress.  HENT:  Head: Normocephalic. Head is without raccoon's eyes and without Battle's sign.    Eyes: Pupils are equal, round, and reactive to light. EOM are normal.  Cardiovascular: Normal rate, regular rhythm and normal heart sounds.   No murmur heard. Pulmonary/Chest: Effort normal and breath sounds normal. No respiratory distress.  No tenderness to chest or ribcage.  Abdominal: Soft. She exhibits no distension. There is no tenderness.  Musculoskeletal:  Full ROM and 5/5 strength of all four extremities. No midline C/T/L spine tenderness. Pelvis stable without tenderness. No tenderness to palpation of upper or right lower extremity. Tenderness to palpation of the left knee. Left knee with full  ROM, intact ligaments and no open wounds.  Neurological: She is alert and oriented to person, place, and time.  CN 2-12 grossly intact. No drift. Strength and sensation intact.  Skin: Skin is warm and dry.  Nursing note and vitals reviewed.    ED Treatments / Results  Labs (all labs ordered are listed, but only abnormal results are displayed) Labs Reviewed - No data to display  EKG  EKG Interpretation None       Radiology Ct Head Wo Contrast  Result Date: 09/15/2017 CLINICAL DATA:  Fall. EXAM: CT HEAD WITHOUT CONTRAST CT CERVICAL SPINE WITHOUT CONTRAST TECHNIQUE: Multidetector CT imaging of the head and cervical spine was performed following the standard protocol without intravenous contrast. Multiplanar CT image reconstructions of the cervical spine were also generated. COMPARISON:  CT head and cervical spine dated June 15, 2016. FINDINGS: CT HEAD FINDINGS Brain: No evidence of acute infarction, hemorrhage, hydrocephalus, extra-axial collection or mass lesion/mass effect. Age-related cerebral atrophy with compensatory dilatation of the ventricles, unchanged. Vascular: Atherosclerotic vascular calcifications of the carotid siphons. No hyperdense vessel. Skull:  Hyperostosis frontalis.  No fracture or focal lesion. Sinuses/Orbits: Scattered mild paranasal sinus mucosal disease, not significantly changed. Mastoid air cells are clear. The orbits are unremarkable. Other: None. CT CERVICAL SPINE FINDINGS Alignment: Trace stepwise retrolisthesis of C3 on C4, C4 on C5, and C5 on C6, favored degenerative. No traumatic malalignment. Skull base and vertebrae: No acute fracture. No primary bone lesion or focal pathologic process. Soft tissues and spinal canal: No prevertebral fluid or swelling. No visible canal hematoma. Disc levels: Moderate multilevel degenerative changes throughout the cervical spine, slightly progressed. Upper chest: No acute abnormality. Chronic biapical scarring. Unchanged thyroid  goiter. Other: None. IMPRESSION: 1.  No acute intracranial abnormality. 2. No acute cervical spine fracture. Moderate multilevel degenerative changes, slightly progressed when compared to prior study. Electronically Signed   By: Titus Dubin M.D.   On: 09/15/2017 21:51   Ct Cervical Spine Wo Contrast  Result Date: 09/15/2017 CLINICAL DATA:  Fall. EXAM: CT HEAD WITHOUT CONTRAST CT CERVICAL SPINE WITHOUT CONTRAST TECHNIQUE: Multidetector CT imaging of the head and cervical spine was performed following the standard protocol without intravenous contrast. Multiplanar CT image reconstructions of the cervical spine were also generated. COMPARISON:  CT head and cervical spine dated June 15, 2016. FINDINGS: CT HEAD FINDINGS Brain: No evidence of acute infarction, hemorrhage, hydrocephalus, extra-axial collection or mass lesion/mass effect. Age-related cerebral atrophy with compensatory dilatation of the ventricles, unchanged. Vascular: Atherosclerotic vascular calcifications of the carotid siphons. No hyperdense vessel. Skull:  Hyperostosis frontalis.  No fracture or focal lesion. Sinuses/Orbits: Scattered mild paranasal sinus mucosal disease, not significantly changed. Mastoid air cells are clear. The orbits are unremarkable. Other: None. CT CERVICAL SPINE FINDINGS Alignment: Trace stepwise retrolisthesis of C3 on C4, C4  on C5, and C5 on C6, favored degenerative. No traumatic malalignment. Skull base and vertebrae: No acute fracture. No primary bone lesion or focal pathologic process. Soft tissues and spinal canal: No prevertebral fluid or swelling. No visible canal hematoma. Disc levels: Moderate multilevel degenerative changes throughout the cervical spine, slightly progressed. Upper chest: No acute abnormality. Chronic biapical scarring. Unchanged thyroid goiter. Other: None. IMPRESSION: 1.  No acute intracranial abnormality. 2. No acute cervical spine fracture. Moderate multilevel degenerative changes, slightly  progressed when compared to prior study. Electronically Signed   By: Titus Dubin M.D.   On: 09/15/2017 21:51   Dg Knee Complete 4 Views Left  Result Date: 09/15/2017 CLINICAL DATA:  Fall. EXAM: LEFT KNEE - COMPLETE 4+ VIEW COMPARISON:  None. FINDINGS: No acute fracture or malalignment. Joint spaces are relatively preserved. No joint effusion. Diffuse osteopenia. Soft tissues are unremarkable. IMPRESSION: Negative. Electronically Signed   By: Titus Dubin M.D.   On: 09/15/2017 21:55    Procedures Procedures (including critical care time)  Medications Ordered in ED Medications - No data to display   Initial Impression / Assessment and Plan / ED Course  I have reviewed the triage vital signs and the nursing notes.  Pertinent labs & imaging results that were available during my care of the patient were reviewed by me and considered in my medical decision making (see chart for details).    Yolanda Kennedy is a 83 y.o. female who presents to ED for evaluation following witnessed fall. No focal neuro deficits. CT head and neck without acute abnormalities. Mild tenderness to palpation of the left knee with full ROM of the LLE. No tenderness to the hips. X-ray of the knee negative. Patient ambulatory in ED without difficulty. Evaluation does not show pathology that would require ongoing emergent intervention or inpatient treatment. Discharged in satisfactory condition  Patient seen by and discussed with Dr. Regenia Skeeter who agrees with treatment plan.    Final Clinical Impressions(s) / ED Diagnoses   Final diagnoses:  Fall, initial encounter  Injury of head, initial encounter    New Prescriptions New Prescriptions   No medications on file     Alonie Gazzola, Ozella Almond, PA-C 09/15/17 2237    Sherwood Gambler, MD 09/16/17 0010

## 2017-09-16 DIAGNOSIS — G8911 Acute pain due to trauma: Secondary | ICD-10-CM | POA: Diagnosis not present

## 2017-09-16 DIAGNOSIS — R4182 Altered mental status, unspecified: Secondary | ICD-10-CM | POA: Diagnosis not present

## 2017-09-22 DIAGNOSIS — E782 Mixed hyperlipidemia: Secondary | ICD-10-CM | POA: Diagnosis not present

## 2017-09-22 DIAGNOSIS — E039 Hypothyroidism, unspecified: Secondary | ICD-10-CM | POA: Diagnosis not present

## 2017-09-22 DIAGNOSIS — I10 Essential (primary) hypertension: Secondary | ICD-10-CM | POA: Diagnosis not present

## 2017-10-02 ENCOUNTER — Emergency Department (HOSPITAL_COMMUNITY)
Admission: EM | Admit: 2017-10-02 | Discharge: 2017-10-02 | Disposition: A | Discharge status: Home / Self Care | Payer: Medicare Other | Attending: Emergency Medicine | Admitting: Emergency Medicine

## 2017-10-02 ENCOUNTER — Encounter (HOSPITAL_COMMUNITY): Payer: Self-pay

## 2017-10-02 DIAGNOSIS — Z79899 Other long term (current) drug therapy: Secondary | ICD-10-CM | POA: Insufficient documentation

## 2017-10-02 DIAGNOSIS — F0390 Unspecified dementia without behavioral disturbance: Secondary | ICD-10-CM | POA: Diagnosis not present

## 2017-10-02 DIAGNOSIS — Z1389 Encounter for screening for other disorder: Secondary | ICD-10-CM | POA: Diagnosis not present

## 2017-10-02 DIAGNOSIS — G8911 Acute pain due to trauma: Secondary | ICD-10-CM | POA: Diagnosis not present

## 2017-10-02 DIAGNOSIS — Y999 Unspecified external cause status: Secondary | ICD-10-CM | POA: Diagnosis not present

## 2017-10-02 DIAGNOSIS — W19XXXA Unspecified fall, initial encounter: Secondary | ICD-10-CM | POA: Insufficient documentation

## 2017-10-02 DIAGNOSIS — Y92129 Unspecified place in nursing home as the place of occurrence of the external cause: Secondary | ICD-10-CM | POA: Diagnosis not present

## 2017-10-02 DIAGNOSIS — Y939 Activity, unspecified: Secondary | ICD-10-CM | POA: Insufficient documentation

## 2017-10-02 DIAGNOSIS — R4182 Altered mental status, unspecified: Secondary | ICD-10-CM | POA: Diagnosis not present

## 2017-10-02 DIAGNOSIS — E119 Type 2 diabetes mellitus without complications: Secondary | ICD-10-CM | POA: Insufficient documentation

## 2017-10-02 DIAGNOSIS — T1490XA Injury, unspecified, initial encounter: Secondary | ICD-10-CM | POA: Diagnosis not present

## 2017-10-02 DIAGNOSIS — I10 Essential (primary) hypertension: Secondary | ICD-10-CM | POA: Diagnosis not present

## 2017-10-02 DIAGNOSIS — Z87891 Personal history of nicotine dependence: Secondary | ICD-10-CM | POA: Diagnosis not present

## 2017-10-02 DIAGNOSIS — Z7984 Long term (current) use of oral hypoglycemic drugs: Secondary | ICD-10-CM | POA: Diagnosis not present

## 2017-10-02 NOTE — ED Notes (Signed)
Bed: HE03 Expected date:  Expected time:  Means of arrival:  Comments: 83 yo fall; eval

## 2017-10-02 NOTE — Discharge Instructions (Signed)
Please read instructions below. She can take tylenol as needed for any body aches.  Her exam today is reassuring. No evidence of injuries. She is not complaining of any pain.  Return to the ER if she begins complaining of severe headache, begins acting out of character, vomiting, or for new or concerning symptoms.

## 2017-10-02 NOTE — ED Provider Notes (Signed)
[facility] DEPT Provider Note   CSN: [id number] Arrival date & time: 10/02/17  3545     History   Chief Complaint Chief Complaint  Patient presents with  . Fall    HPI Yolanda Kennedy is a 83 y.o. female past medical history of hypertension, diabetes, Alzheimer and chills, presenting to the ED from nursing home for unwitnessed fall.  Per staff, patient was last seen in her chair 30 minutes prior to running her on the ground.  Per staff, patient is at her baseline.  Patient not complaining of pain, nausea, headache, or any complaints.  LEVEL 5 CAVEAT 2/t DEMENTIA  The history is provided by the patient and the nursing home. The history is limited by the condition of the patient.    Past Medical History:  Diagnosis Date  . Dementia   . Diabetes mellitus without complication ([place])   . Hyperlipidemia   . Hypertension     Patient Active Problem List   Diagnosis Date Noted  . Acute encephalopathy 06/15/2016  . UTI (lower urinary tract infection) 06/15/2016  . Fall 06/15/2016  . Essential hypertension 06/15/2016  . Diabetes mellitus with complication ([place]) 62/56/3893  . Dementia 06/15/2016  . Facial contusion     Past Surgical History:  Procedure Laterality Date  . CHOLECYSTECTOMY      OB History    No data available       Home Medications    Prior to Admission medications   Medication Sig Start Date End Date Taking? Authorizing Provider  glimepiride (AMARYL) 4 MG tablet Take 4 mg by mouth daily with breakfast.    [provider]  haloperidol (HALDOL) 0.5 MG tablet Take 0.25 mg by mouth daily.  08/31/15   [provider]  hydrochlorothiazide (HYDRODIURIL) 25 MG tablet Take 25 mg by mouth daily.    [provider]  lisinopril (PRINIVIL,ZESTRIL) 40 MG tablet Take 40 mg by mouth daily.    [provider]  LORazepam (ATIVAN) 0.5 MG tablet Take 0.5 mg by mouth at bedtime.    [provider]    metFORMIN (GLUCOPHAGE) 500 MG tablet Take 500-1,000 mg by mouth 2 (two) times daily with a meal. 1000mg  in the morning, 500mg  in the evening    [provider]  metoprolol tartrate (LOPRESSOR) 25 MG tablet Take 25 mg by mouth daily.    [provider]  Multiple Vitamin (HEALTHY HAIR/SKIN/NAILS) TABS Take 1 tablet by mouth daily.     [provider]  traZODone (DESYREL) 50 MG tablet Take 100 mg by mouth at bedtime.     [provider]    Family History Family History  Family history unknown: Yes    Social History Social History   Tobacco Use  . Smoking status: Former Research scientist (life sciences)  . Smokeless tobacco: Former Systems developer    Types: Chew  Substance Use Topics  . Alcohol use: No    Alcohol/week: 0.0 oz  . Drug use: No     Allergies   Patient has no known allergies.   Review of Systems Review of Systems  Unable to perform ROS: Dementia  Eyes: Negative for visual disturbance.  Musculoskeletal: Negative for arthralgias, back pain, myalgias and neck pain.  Neurological: Negative for headaches.     Physical Exam Updated Vital Signs BP (!) 198/76 (BP Location: Left Arm)   Pulse 66   Temp 98 F (36.7 C) (Oral)   Resp 16   Ht 5' (1.524 m)   Wt  56.7 kg (125 lb)   SpO2 99%   BMI 24.41 kg/m   Physical Exam  Constitutional: She appears well-developed and well-nourished. No distress.  HENT:  Head: Normocephalic and atraumatic.  Mouth/Throat: Oropharynx is clear and moist.  No scalp hematoma.  Eyes: Conjunctivae and EOM are normal. Pupils are equal, round, and reactive to light.  Neck: Normal range of motion. Neck supple.  Cardiovascular: Normal rate, regular rhythm, normal heart sounds and intact distal pulses. Exam reveals no friction rub.  No murmur heard. Pulmonary/Chest: Effort normal and breath sounds normal. No stridor. No respiratory distress. She has no wheezes. She exhibits no tenderness.  Abdominal: Soft. Bowel sounds are normal. She  exhibits no distension. There is no tenderness. There is no rebound and no guarding.  Musculoskeletal: Normal range of motion. She exhibits no edema, tenderness or deformity.  Midline spinal or paraspinal tenderness, no bony step-offs, no gross deformities.  Bilateral upper and lower extremities without evidence of injury, no ecchymosis or tenderness.  Neurological: She is alert.  Patient oriented to person,, however stating she is 83 years old and fell while she was at the store today. Mental Status:  Alert. Speech fluent without evidence of aphasia. Able to follow 2 step commands. Cranial Nerves:  II:  Peripheral visual fields grossly normal, pupils equal, round, reactive to light III,IV, VI: ptosis not present, extra-ocular motions intact bilaterally  V,VII: smile symmetric, facial light touch sensation equal VIII: hearing grossly normal to voice  X: uvula elevates symmetrically  XI: bilateral shoulder shrug symmetric and strong XII: midline tongue extension without fassiculations Motor:  Normal tone. Equal strength in upper and lower extremities bilaterally including strong and equal grip strength and dorsiflexion/plantar flexion Sensory: Pinprick and light touch normal in all extremities.  Deep Tendon Reflexes: 2+ and symmetric in the biceps and patella Cerebellar: normal finger-to-nose with bilateral upper extremities CV: distal pulses palpable throughout    Skin: Skin is warm and dry.  Psychiatric: She has a normal mood and affect. Her behavior is normal.  Nursing note and vitals reviewed.    ED Treatments / Results  Labs (all labs ordered are listed, but only abnormal results are displayed) Labs Reviewed - No data to display  EKG  EKG Interpretation None       Radiology No results found.  Procedures Procedures (including critical care time)  Medications Ordered in ED Medications - No data to display   Initial Impression / Assessment and Plan / ED Course  I  have reviewed the triage vital signs and the nursing notes.  Pertinent labs & imaging results that were available during my care of the patient were reviewed by me and considered in my medical decision making (see chart for details).     Patient presenting from nursing home for unwitnessed fall.  Patient not complaining of any pain.  On exam, no evidence of obvious injuries, following simple commands with no obvious neuro deficits.  She is well-appearing, resting comfortably, not in distress.  She is not on anticoagulation.  No indication for imaging at this time.  Will discharge back to nursing facility, with strict return precautions. Patient discussed with and seen by Dr. Alvino Chapel, who agrees with care plan for discharge.  Discussed results, findings, treatment and follow up. Return precautions listed in discharge paperwork.   Final Clinical Impressions(s) / ED Diagnoses   Final diagnoses:  Fall, initial encounter    New Prescriptions This SmartLink is deprecated. Use AVSMEDLIST instead to display the medication list  for a patient.   Jacey Eckerson, [place] N, PA-C 10/02/17 [place], Nathan, MD 10/02/17 [phone]

## 2017-10-02 NOTE — ED Triage Notes (Signed)
Per EMS. Unwitnessed fall, last seen in chair 30 mins prior to fall, found on floor, no thinners. Baseline per staff, Hx Alzheimers and Diabetes, No complaint of pain. No obvious LAC, hematoma or deformity.  BP 136/68 HR 74 NS CBG 162 RESP 16 SA02 98 RA

## 2017-11-09 ENCOUNTER — Encounter (HOSPITAL_BASED_OUTPATIENT_CLINIC_OR_DEPARTMENT_OTHER): Payer: Self-pay | Admitting: Emergency Medicine

## 2017-11-09 ENCOUNTER — Emergency Department (HOSPITAL_BASED_OUTPATIENT_CLINIC_OR_DEPARTMENT_OTHER)
Admission: EM | Admit: 2017-11-09 | Discharge: 2017-11-09 | Disposition: A | Discharge status: Home / Self Care | Payer: Medicare Other | Attending: Emergency Medicine | Admitting: Emergency Medicine

## 2017-11-09 ENCOUNTER — Emergency Department (HOSPITAL_BASED_OUTPATIENT_CLINIC_OR_DEPARTMENT_OTHER): Payer: Medicare Other

## 2017-11-09 DIAGNOSIS — W19XXXA Unspecified fall, initial encounter: Secondary | ICD-10-CM

## 2017-11-09 DIAGNOSIS — F0390 Unspecified dementia without behavioral disturbance: Secondary | ICD-10-CM

## 2017-11-09 DIAGNOSIS — I10 Essential (primary) hypertension: Secondary | ICD-10-CM | POA: Diagnosis not present

## 2017-11-09 DIAGNOSIS — Z7984 Long term (current) use of oral hypoglycemic drugs: Secondary | ICD-10-CM | POA: Insufficient documentation

## 2017-11-09 DIAGNOSIS — E785 Hyperlipidemia, unspecified: Secondary | ICD-10-CM | POA: Insufficient documentation

## 2017-11-09 DIAGNOSIS — M549 Dorsalgia, unspecified: Secondary | ICD-10-CM | POA: Diagnosis not present

## 2017-11-09 DIAGNOSIS — Z79899 Other long term (current) drug therapy: Secondary | ICD-10-CM | POA: Insufficient documentation

## 2017-11-09 DIAGNOSIS — T148XXA Other injury of unspecified body region, initial encounter: Secondary | ICD-10-CM | POA: Diagnosis not present

## 2017-11-09 DIAGNOSIS — G8911 Acute pain due to trauma: Secondary | ICD-10-CM | POA: Diagnosis not present

## 2017-11-09 DIAGNOSIS — R4182 Altered mental status, unspecified: Secondary | ICD-10-CM | POA: Diagnosis not present

## 2017-11-09 DIAGNOSIS — Z87891 Personal history of nicotine dependence: Secondary | ICD-10-CM | POA: Insufficient documentation

## 2017-11-09 DIAGNOSIS — M5489 Other dorsalgia: Secondary | ICD-10-CM | POA: Diagnosis not present

## 2017-11-09 DIAGNOSIS — E119 Type 2 diabetes mellitus without complications: Secondary | ICD-10-CM | POA: Insufficient documentation

## 2017-11-09 DIAGNOSIS — G309 Alzheimer's disease, unspecified: Secondary | ICD-10-CM | POA: Insufficient documentation

## 2017-11-09 DIAGNOSIS — S3993XA Unspecified injury of pelvis, initial encounter: Secondary | ICD-10-CM | POA: Diagnosis not present

## 2017-11-09 DIAGNOSIS — S3992XA Unspecified injury of lower back, initial encounter: Secondary | ICD-10-CM | POA: Diagnosis not present

## 2017-11-09 HISTORY — DX: Malignant neoplasm of colon, unspecified: C18.9

## 2017-11-09 HISTORY — DX: Dementia in other diseases classified elsewhere, unspecified severity, without behavioral disturbance, psychotic disturbance, mood disturbance, and anxiety: F02.80

## 2017-11-09 HISTORY — DX: Alzheimer's disease, unspecified: G30.9

## 2017-11-09 NOTE — ED Notes (Signed)
[facility] called for transport.  

## 2017-11-09 NOTE — ED Notes (Signed)
Report given to [place], med tech at [facility] and notified of elevated b/p

## 2017-11-09 NOTE — ED Notes (Signed)
Pt back from xray and was soiled with urine; pt cleaned up and linens changed

## 2017-11-09 NOTE — Discharge Instructions (Signed)
.  1.  At this time, no injury is identified.  X-rays have been done of the pelvis and the low back without identified acute fracture.  It appears to have some chronic compression fractures. 2.  The patient does not express pain during her evaluation in the emergency department. 3.  Please have the patient rechecked by her primary provider as soon as possible.

## 2017-11-09 NOTE — ED Provider Notes (Signed)
[facility] EMERGENCY DEPARTMENT Provider Note   CSN: [id number] Arrival date & time: 11/09/17  1643     History   Chief Complaint Chief Complaint  Patient presents with  . Fall    HPI Yolanda Kennedy is a 83 y.o. female.  HPI Patient lives in memory care unit for Alzheimer's dementia.  Reportedly she fell today and was lying on her side.  That time, patient complained of back pain.  Per my evaluation, the patient has no pain complaints.  She is alert and cheerful and interactive.  She references multiple events that are very unlikely to have actually occurred, assistant with dementia.  She however shows no signs of distress or pain or level of alertness. Past Medical History:  Diagnosis Date  . Alzheimer's disease   . Colon cancer ([place])   . Dementia   . Diabetes mellitus without complication ([place])   . Hyperlipidemia   . Hypertension     Patient Active Problem List   Diagnosis Date Noted  . Acute encephalopathy 06/15/2016  . UTI (lower urinary tract infection) 06/15/2016  . Fall 06/15/2016  . Essential hypertension 06/15/2016  . Diabetes mellitus with complication ([place]) 22/12/5425  . Dementia 06/15/2016  . Facial contusion     Past Surgical History:  Procedure Laterality Date  . CHOLECYSTECTOMY      OB History    No data available       Home Medications    Prior to Admission medications   Medication Sig Start Date End Date Taking? Authorizing Provider  cholecalciferol (VITAMIN D) 1000 units tablet Take 1,000 Units by mouth daily.   Yes [provider]  Cholecalciferol (VITAMIN D) 2000 units CAPS Take 1 capsule by mouth daily.   Yes [provider]  Dextromethorphan-Guaifenesin 10-200 MG/5ML LIQD Take 5 mLs by mouth every 4 (four) hours as needed.   Yes [provider]  furosemide (LASIX) 20 MG tablet Take 20 mg by mouth.   Yes [provider]  geriatric multivitamins-minerals (ELDERTONIC/GEVRABON) ELIX Take 15  mLs by mouth 2 (two) times daily.   Yes [provider]  glimepiride (AMARYL) 4 MG tablet Take 4 mg by mouth daily with breakfast.   Yes [provider]  lisinopril (PRINIVIL,ZESTRIL) 20 MG tablet Take 20 mg by mouth daily.   Yes [provider]  loperamide (IMODIUM A-D) 2 MG tablet Take 2 mg by mouth as needed for diarrhea or loose stools (one table orally as needed after each loose stool.  Max 8 tabs per 24 hours).   Yes [provider]  LORazepam (ATIVAN) 0.5 MG tablet Take 0.25 mg by mouth at bedtime.    Yes [provider]  metFORMIN (GLUCOPHAGE) 500 MG tablet Take 500 mg by mouth 2 (two) times daily with a meal. 1000mg  in the morning, 500mg  in the evening   Yes [provider]  metoprolol tartrate (LOPRESSOR) 25 MG tablet Take 25 mg by mouth daily.   Yes [provider]  mirtazapine (REMERON) 15 MG tablet Take 15 mg by mouth at bedtime.   Yes [provider]  mirtazapine (REMERON) 7.5 MG tablet Take 7.5 mg by mouth at bedtime.   Yes [provider]  traMADol (ULTRAM) 50 MG tablet Take by mouth 3 (three) times daily as needed.   Yes [provider]  traZODone (DESYREL) 50 MG tablet Take 100 mg by mouth at bedtime.    Yes [provider]    Family History Family History  Family history unknown: Yes    Social History Social History   Tobacco Use  . Smoking status: Former Research scientist (life sciences)  . Smokeless tobacco: Former Systems developer    Types: Chew  Substance Use Topics  . Alcohol use: No    Alcohol/week: 0.0 oz  . Drug use: No     Allergies   Patient has no known allergies.   Review of Systems Review of Systems Level 5 caveat cannot obtain due to dementia.  Physical Exam Updated Vital Signs BP (!) 232/77 (BP Location: Right Arm)   Pulse 64   Temp (!) 97.5 F (36.4 C) (Oral)   Resp 18   SpO2 93%   Physical Exam  Constitutional: She appears well-developed and well-nourished. No distress.    HENT:  Head: Normocephalic and atraumatic.  Nose: Nose normal.  Mouth/Throat: Oropharynx is clear and moist.  Eyes: EOM are normal. Pupils are equal, round, and reactive to light.  Neck: Neck supple.  No C-spine tenderness to palpation.  Cardiovascular: Normal rate, regular rhythm, normal heart sounds and intact distal pulses.  Pulmonary/Chest: Effort normal and breath sounds normal.  Chest wall nontender.  No visible contusions or abrasions.  No contusions or abrasions to the spine.  Patient is able to sit forward for pulmonary exam without evident pain.  Abdominal: Soft. She exhibits no distension. There is no tenderness. There is no guarding.  Musculoskeletal: Normal range of motion. She exhibits no tenderness or deformity.  Patient does not have evident deformities or significant contusions abrasions to extremities.  She uses both arms to assist in pulling herself forward in the stretcher for examination.  Have put her lower extremities to flexion extension range of motion and she denies pain.  She denies pain to compression over the hips.  She has 1-2+ pitting edema of the lower legs.  Skin: Skin is warm and dry.  Psychiatric:  Mood is pleasant.     ED Treatments / Results  Labs (all labs ordered are listed, but only abnormal results are displayed) Labs Reviewed - No data to display  EKG  EKG Interpretation None       Radiology Dg Lumbar Spine 2-3 Views  Result Date: 11/09/2017 CLINICAL DATA:  [place] today. EXAM: LUMBAR SPINE - 2-3 VIEW COMPARISON:  None. FINDINGS: Severe lumbar curvature, left convex at L3. Marked compression of T12, new from 12/28/2007 but more likely chronic. Moderate compression of L4, probably new from 20/9, more likely chronic. The other lumbar vertebrae are normal in height. Moderately severe degenerative lumbar disc changes. Sacroiliac joints are unremarkable. IMPRESSION: Compressions of T12 and L4 are more likely chronic. No acute fracture is evident.  Severe curvature and degenerative changes. Electronically Signed   By: Andreas Newport M.D.   On: 11/09/2017 19:10   Dg Pelvis 1-2 Views  Result Date: 11/09/2017 CLINICAL DATA:  Fall. EXAM: PELVIS - 1-2 VIEW COMPARISON:  None. FINDINGS: There is no evidence of pelvic fracture or diastasis. No pelvic bone lesions are seen. Bones are osteopenic. Mild osteoarthritis noted of both hip joints. IMPRESSION: No evidence of fracture. Electronically Signed   By: Aletta Edouard M.D.   On: 11/09/2017 19:07    Procedures Procedures (including critical care time)  Medications Ordered in ED Medications - No data to display   Initial Impression / Assessment and Plan / ED Course  I have reviewed the triage vital signs and the nursing notes.  Pertinent labs & imaging results that were available during my care of the patient were reviewed  by me and considered in my medical decision making (see chart for details).      Final Clinical Impressions(s) / ED Diagnoses   Final diagnoses:  Fall, initial encounter  Dementia without behavioral disturbance, unspecified dementia type   Patient comes with history of a fall at nursing home.  I have proceeded with lumbar x-rays and pelvic x-rays without acute findings.  Patient does appear to have older compression fractures.  May cause her chronic pain.  This time she is stable for return to nursing home care.  Advised for recheck by her primary provider within the next couple of days. ED Discharge Orders    None       Charlesetta Shanks, MD 11/09/17 [phone]

## 2017-11-14 DIAGNOSIS — C189 Malignant neoplasm of colon, unspecified: Secondary | ICD-10-CM | POA: Diagnosis not present

## 2017-11-14 DIAGNOSIS — S272XXA Traumatic hemopneumothorax, initial encounter: Secondary | ICD-10-CM | POA: Diagnosis not present

## 2017-11-14 DIAGNOSIS — F0280 Dementia in other diseases classified elsewhere without behavioral disturbance: Secondary | ICD-10-CM | POA: Diagnosis not present

## 2017-11-14 DIAGNOSIS — Z741 Need for assistance with personal care: Secondary | ICD-10-CM | POA: Diagnosis not present

## 2017-11-14 DIAGNOSIS — S2249XA Multiple fractures of ribs, unspecified side, initial encounter for closed fracture: Secondary | ICD-10-CM | POA: Diagnosis not present

## 2017-11-14 DIAGNOSIS — Z7409 Other reduced mobility: Secondary | ICD-10-CM | POA: Diagnosis not present

## 2017-11-14 DIAGNOSIS — Z87891 Personal history of nicotine dependence: Secondary | ICD-10-CM | POA: Diagnosis not present

## 2017-11-14 DIAGNOSIS — S270XXA Traumatic pneumothorax, initial encounter: Secondary | ICD-10-CM | POA: Diagnosis not present

## 2017-11-14 DIAGNOSIS — E11649 Type 2 diabetes mellitus with hypoglycemia without coma: Secondary | ICD-10-CM | POA: Diagnosis not present

## 2017-11-14 DIAGNOSIS — F0390 Unspecified dementia without behavioral disturbance: Secondary | ICD-10-CM | POA: Diagnosis not present

## 2017-11-14 DIAGNOSIS — R0600 Dyspnea, unspecified: Secondary | ICD-10-CM | POA: Diagnosis not present

## 2017-11-14 DIAGNOSIS — E119 Type 2 diabetes mellitus without complications: Secondary | ICD-10-CM | POA: Diagnosis not present

## 2017-11-14 DIAGNOSIS — M549 Dorsalgia, unspecified: Secondary | ICD-10-CM | POA: Diagnosis not present

## 2017-11-14 DIAGNOSIS — E041 Nontoxic single thyroid nodule: Secondary | ICD-10-CM | POA: Diagnosis not present

## 2017-11-14 DIAGNOSIS — J9811 Atelectasis: Secondary | ICD-10-CM | POA: Diagnosis not present

## 2017-11-14 DIAGNOSIS — G308 Other Alzheimer's disease: Secondary | ICD-10-CM | POA: Diagnosis not present

## 2017-11-14 DIAGNOSIS — I10 Essential (primary) hypertension: Secondary | ICD-10-CM | POA: Diagnosis not present

## 2017-11-14 DIAGNOSIS — R41841 Cognitive communication deficit: Secondary | ICD-10-CM | POA: Diagnosis not present

## 2017-11-14 DIAGNOSIS — R278 Other lack of coordination: Secondary | ICD-10-CM | POA: Diagnosis not present

## 2017-11-14 DIAGNOSIS — J90 Pleural effusion, not elsewhere classified: Secondary | ICD-10-CM | POA: Diagnosis not present

## 2017-11-14 DIAGNOSIS — R1310 Dysphagia, unspecified: Secondary | ICD-10-CM | POA: Diagnosis not present

## 2017-11-14 DIAGNOSIS — R0602 Shortness of breath: Secondary | ICD-10-CM | POA: Diagnosis not present

## 2017-11-14 DIAGNOSIS — S2242XA Multiple fractures of ribs, left side, initial encounter for closed fracture: Secondary | ICD-10-CM | POA: Diagnosis not present

## 2017-11-14 DIAGNOSIS — J948 Other specified pleural conditions: Secondary | ICD-10-CM | POA: Diagnosis not present

## 2017-11-14 DIAGNOSIS — G309 Alzheimer's disease, unspecified: Secondary | ICD-10-CM | POA: Diagnosis not present

## 2017-11-14 DIAGNOSIS — Z85038 Personal history of other malignant neoplasm of large intestine: Secondary | ICD-10-CM | POA: Diagnosis not present

## 2017-11-14 DIAGNOSIS — G8911 Acute pain due to trauma: Secondary | ICD-10-CM | POA: Diagnosis not present

## 2017-11-14 DIAGNOSIS — M6281 Muscle weakness (generalized): Secondary | ICD-10-CM | POA: Diagnosis not present

## 2017-11-14 DIAGNOSIS — S2760XA Unspecified injury of pleura, initial encounter: Secondary | ICD-10-CM | POA: Diagnosis not present

## 2017-11-14 DIAGNOSIS — J939 Pneumothorax, unspecified: Secondary | ICD-10-CM | POA: Diagnosis not present

## 2017-11-14 DIAGNOSIS — S299XXA Unspecified injury of thorax, initial encounter: Secondary | ICD-10-CM | POA: Diagnosis not present

## 2017-11-15 DIAGNOSIS — S2242XA Multiple fractures of ribs, left side, initial encounter for closed fracture: Secondary | ICD-10-CM | POA: Diagnosis not present

## 2017-11-15 DIAGNOSIS — S2760XA Unspecified injury of pleura, initial encounter: Secondary | ICD-10-CM | POA: Diagnosis not present

## 2017-11-15 DIAGNOSIS — F0390 Unspecified dementia without behavioral disturbance: Secondary | ICD-10-CM | POA: Diagnosis not present

## 2017-11-15 DIAGNOSIS — S270XXA Traumatic pneumothorax, initial encounter: Secondary | ICD-10-CM | POA: Diagnosis not present

## 2017-11-16 DIAGNOSIS — S2760XA Unspecified injury of pleura, initial encounter: Secondary | ICD-10-CM | POA: Diagnosis not present

## 2017-11-16 DIAGNOSIS — J90 Pleural effusion, not elsewhere classified: Secondary | ICD-10-CM | POA: Diagnosis not present

## 2017-11-16 DIAGNOSIS — F0390 Unspecified dementia without behavioral disturbance: Secondary | ICD-10-CM | POA: Diagnosis not present

## 2017-11-16 DIAGNOSIS — S270XXA Traumatic pneumothorax, initial encounter: Secondary | ICD-10-CM | POA: Diagnosis not present

## 2017-11-16 DIAGNOSIS — S2242XA Multiple fractures of ribs, left side, initial encounter for closed fracture: Secondary | ICD-10-CM | POA: Diagnosis not present

## 2017-11-17 DIAGNOSIS — J9811 Atelectasis: Secondary | ICD-10-CM | POA: Diagnosis not present

## 2017-11-17 DIAGNOSIS — S2242XA Multiple fractures of ribs, left side, initial encounter for closed fracture: Secondary | ICD-10-CM | POA: Diagnosis not present

## 2017-11-17 DIAGNOSIS — J90 Pleural effusion, not elsewhere classified: Secondary | ICD-10-CM | POA: Diagnosis not present

## 2017-11-17 DIAGNOSIS — S272XXA Traumatic hemopneumothorax, initial encounter: Secondary | ICD-10-CM | POA: Diagnosis not present

## 2017-11-17 DIAGNOSIS — S2249XA Multiple fractures of ribs, unspecified side, initial encounter for closed fracture: Secondary | ICD-10-CM | POA: Diagnosis not present

## 2017-11-17 DIAGNOSIS — J939 Pneumothorax, unspecified: Secondary | ICD-10-CM | POA: Diagnosis not present

## 2017-11-17 DIAGNOSIS — J948 Other specified pleural conditions: Secondary | ICD-10-CM | POA: Diagnosis not present

## 2017-11-18 DIAGNOSIS — J939 Pneumothorax, unspecified: Secondary | ICD-10-CM | POA: Diagnosis not present

## 2017-11-18 DIAGNOSIS — S2249XA Multiple fractures of ribs, unspecified side, initial encounter for closed fracture: Secondary | ICD-10-CM | POA: Diagnosis not present

## 2017-11-18 DIAGNOSIS — G309 Alzheimer's disease, unspecified: Secondary | ICD-10-CM | POA: Diagnosis not present

## 2017-11-18 DIAGNOSIS — J90 Pleural effusion, not elsewhere classified: Secondary | ICD-10-CM | POA: Diagnosis not present

## 2017-11-18 DIAGNOSIS — S272XXA Traumatic hemopneumothorax, initial encounter: Secondary | ICD-10-CM | POA: Diagnosis not present

## 2017-11-19 DIAGNOSIS — S272XXA Traumatic hemopneumothorax, initial encounter: Secondary | ICD-10-CM | POA: Diagnosis not present

## 2017-11-19 DIAGNOSIS — G309 Alzheimer's disease, unspecified: Secondary | ICD-10-CM | POA: Diagnosis not present

## 2017-11-19 DIAGNOSIS — S2249XA Multiple fractures of ribs, unspecified side, initial encounter for closed fracture: Secondary | ICD-10-CM | POA: Diagnosis not present

## 2017-11-19 DIAGNOSIS — J90 Pleural effusion, not elsewhere classified: Secondary | ICD-10-CM | POA: Diagnosis not present

## 2017-11-20 DIAGNOSIS — S272XXA Traumatic hemopneumothorax, initial encounter: Secondary | ICD-10-CM | POA: Diagnosis not present

## 2017-11-20 DIAGNOSIS — G309 Alzheimer's disease, unspecified: Secondary | ICD-10-CM | POA: Diagnosis not present

## 2017-11-20 DIAGNOSIS — S2249XA Multiple fractures of ribs, unspecified side, initial encounter for closed fracture: Secondary | ICD-10-CM | POA: Diagnosis not present

## 2017-11-20 DIAGNOSIS — J90 Pleural effusion, not elsewhere classified: Secondary | ICD-10-CM | POA: Diagnosis not present

## 2017-11-21 DIAGNOSIS — S272XXA Traumatic hemopneumothorax, initial encounter: Secondary | ICD-10-CM | POA: Diagnosis not present

## 2017-11-21 DIAGNOSIS — J90 Pleural effusion, not elsewhere classified: Secondary | ICD-10-CM | POA: Diagnosis not present

## 2017-11-21 DIAGNOSIS — G309 Alzheimer's disease, unspecified: Secondary | ICD-10-CM | POA: Diagnosis not present

## 2017-11-21 DIAGNOSIS — S2249XA Multiple fractures of ribs, unspecified side, initial encounter for closed fracture: Secondary | ICD-10-CM | POA: Diagnosis not present

## 2017-11-22 DIAGNOSIS — S2249XA Multiple fractures of ribs, unspecified side, initial encounter for closed fracture: Secondary | ICD-10-CM | POA: Diagnosis not present

## 2017-11-22 DIAGNOSIS — S272XXA Traumatic hemopneumothorax, initial encounter: Secondary | ICD-10-CM | POA: Diagnosis not present

## 2017-11-22 DIAGNOSIS — G309 Alzheimer's disease, unspecified: Secondary | ICD-10-CM | POA: Diagnosis not present

## 2017-11-22 DIAGNOSIS — J90 Pleural effusion, not elsewhere classified: Secondary | ICD-10-CM | POA: Diagnosis not present

## 2017-11-23 DIAGNOSIS — R41841 Cognitive communication deficit: Secondary | ICD-10-CM | POA: Diagnosis not present

## 2017-11-23 DIAGNOSIS — C189 Malignant neoplasm of colon, unspecified: Secondary | ICD-10-CM | POA: Diagnosis not present

## 2017-11-23 DIAGNOSIS — J948 Other specified pleural conditions: Secondary | ICD-10-CM | POA: Diagnosis not present

## 2017-11-23 DIAGNOSIS — Z741 Need for assistance with personal care: Secondary | ICD-10-CM | POA: Diagnosis not present

## 2017-11-23 DIAGNOSIS — S2242XG Multiple fractures of ribs, left side, subsequent encounter for fracture with delayed healing: Secondary | ICD-10-CM | POA: Diagnosis not present

## 2017-11-23 DIAGNOSIS — M6281 Muscle weakness (generalized): Secondary | ICD-10-CM | POA: Diagnosis not present

## 2017-11-23 DIAGNOSIS — S2242XA Multiple fractures of ribs, left side, initial encounter for closed fracture: Secondary | ICD-10-CM | POA: Diagnosis not present

## 2017-11-23 DIAGNOSIS — R1310 Dysphagia, unspecified: Secondary | ICD-10-CM | POA: Diagnosis not present

## 2017-11-23 DIAGNOSIS — E119 Type 2 diabetes mellitus without complications: Secondary | ICD-10-CM | POA: Diagnosis not present

## 2017-11-23 DIAGNOSIS — G308 Other Alzheimer's disease: Secondary | ICD-10-CM | POA: Diagnosis not present

## 2017-11-23 DIAGNOSIS — F0280 Dementia in other diseases classified elsewhere without behavioral disturbance: Secondary | ICD-10-CM | POA: Diagnosis not present

## 2017-11-23 DIAGNOSIS — G309 Alzheimer's disease, unspecified: Secondary | ICD-10-CM | POA: Diagnosis not present

## 2017-11-23 DIAGNOSIS — R278 Other lack of coordination: Secondary | ICD-10-CM | POA: Diagnosis not present

## 2017-11-23 DIAGNOSIS — J90 Pleural effusion, not elsewhere classified: Secondary | ICD-10-CM | POA: Diagnosis not present

## 2017-11-25 DIAGNOSIS — F0280 Dementia in other diseases classified elsewhere without behavioral disturbance: Secondary | ICD-10-CM | POA: Diagnosis not present

## 2017-11-25 DIAGNOSIS — J948 Other specified pleural conditions: Secondary | ICD-10-CM | POA: Diagnosis not present

## 2017-11-25 DIAGNOSIS — G308 Other Alzheimer's disease: Secondary | ICD-10-CM | POA: Diagnosis not present

## 2017-11-25 DIAGNOSIS — S2242XG Multiple fractures of ribs, left side, subsequent encounter for fracture with delayed healing: Secondary | ICD-10-CM | POA: Diagnosis not present

## 2017-12-09 DIAGNOSIS — E119 Type 2 diabetes mellitus without complications: Secondary | ICD-10-CM | POA: Diagnosis not present

## 2017-12-09 DIAGNOSIS — G308 Other Alzheimer's disease: Secondary | ICD-10-CM | POA: Diagnosis not present

## 2017-12-09 DIAGNOSIS — J948 Other specified pleural conditions: Secondary | ICD-10-CM | POA: Diagnosis not present

## 2017-12-09 DIAGNOSIS — F0280 Dementia in other diseases classified elsewhere without behavioral disturbance: Secondary | ICD-10-CM | POA: Diagnosis not present

## 2017-12-29 DIAGNOSIS — I10 Essential (primary) hypertension: Secondary | ICD-10-CM | POA: Diagnosis not present

## 2017-12-29 DIAGNOSIS — E119 Type 2 diabetes mellitus without complications: Secondary | ICD-10-CM | POA: Diagnosis not present

## 2017-12-29 DIAGNOSIS — F0280 Dementia in other diseases classified elsewhere without behavioral disturbance: Secondary | ICD-10-CM | POA: Diagnosis not present

## 2017-12-29 DIAGNOSIS — G309 Alzheimer's disease, unspecified: Secondary | ICD-10-CM | POA: Diagnosis not present

## 2017-12-29 DIAGNOSIS — S2242XD Multiple fractures of ribs, left side, subsequent encounter for fracture with routine healing: Secondary | ICD-10-CM | POA: Diagnosis not present

## 2018-01-04 DIAGNOSIS — Z79899 Other long term (current) drug therapy: Secondary | ICD-10-CM | POA: Diagnosis not present

## 2018-01-05 DIAGNOSIS — E559 Vitamin D deficiency, unspecified: Secondary | ICD-10-CM | POA: Diagnosis not present

## 2018-01-05 DIAGNOSIS — E1121 Type 2 diabetes mellitus with diabetic nephropathy: Secondary | ICD-10-CM | POA: Diagnosis not present

## 2018-01-05 DIAGNOSIS — E119 Type 2 diabetes mellitus without complications: Secondary | ICD-10-CM | POA: Diagnosis not present

## 2018-01-05 DIAGNOSIS — I10 Essential (primary) hypertension: Secondary | ICD-10-CM | POA: Diagnosis not present

## 2018-01-17 DIAGNOSIS — R030 Elevated blood-pressure reading, without diagnosis of hypertension: Secondary | ICD-10-CM | POA: Diagnosis not present

## 2018-01-17 DIAGNOSIS — R609 Edema, unspecified: Secondary | ICD-10-CM | POA: Diagnosis not present

## 2018-01-17 DIAGNOSIS — D72829 Elevated white blood cell count, unspecified: Secondary | ICD-10-CM | POA: Diagnosis not present

## 2018-01-17 DIAGNOSIS — R4182 Altered mental status, unspecified: Secondary | ICD-10-CM | POA: Diagnosis not present

## 2018-01-17 DIAGNOSIS — I10 Essential (primary) hypertension: Secondary | ICD-10-CM | POA: Diagnosis not present

## 2018-01-17 DIAGNOSIS — R600 Localized edema: Secondary | ICD-10-CM | POA: Diagnosis not present

## 2018-01-19 DIAGNOSIS — I10 Essential (primary) hypertension: Secondary | ICD-10-CM | POA: Diagnosis not present

## 2018-02-22 DIAGNOSIS — Z79899 Other long term (current) drug therapy: Secondary | ICD-10-CM | POA: Diagnosis not present

## 2018-04-12 DIAGNOSIS — R4182 Altered mental status, unspecified: Secondary | ICD-10-CM | POA: Diagnosis not present

## 2018-04-14 ENCOUNTER — Emergency Department (HOSPITAL_COMMUNITY): Payer: Medicare Other

## 2018-04-14 ENCOUNTER — Emergency Department (HOSPITAL_COMMUNITY)
Admission: EM | Admit: 2018-04-14 | Discharge: 2018-04-14 | Disposition: A | Discharge status: Home / Self Care | Payer: Medicare Other | Attending: Emergency Medicine | Admitting: Emergency Medicine

## 2018-04-14 DIAGNOSIS — S3993XA Unspecified injury of pelvis, initial encounter: Secondary | ICD-10-CM | POA: Diagnosis not present

## 2018-04-14 DIAGNOSIS — M25552 Pain in left hip: Secondary | ICD-10-CM | POA: Diagnosis not present

## 2018-04-14 DIAGNOSIS — Y92122 Bedroom in nursing home as the place of occurrence of the external cause: Secondary | ICD-10-CM | POA: Diagnosis not present

## 2018-04-14 DIAGNOSIS — W1830XA Fall on same level, unspecified, initial encounter: Secondary | ICD-10-CM | POA: Insufficient documentation

## 2018-04-14 DIAGNOSIS — Z85038 Personal history of other malignant neoplasm of large intestine: Secondary | ICD-10-CM | POA: Diagnosis not present

## 2018-04-14 DIAGNOSIS — S299XXA Unspecified injury of thorax, initial encounter: Secondary | ICD-10-CM | POA: Diagnosis not present

## 2018-04-14 DIAGNOSIS — Z87891 Personal history of nicotine dependence: Secondary | ICD-10-CM | POA: Diagnosis not present

## 2018-04-14 DIAGNOSIS — S098XXA Other specified injuries of head, initial encounter: Secondary | ICD-10-CM | POA: Diagnosis not present

## 2018-04-14 DIAGNOSIS — Y939 Activity, unspecified: Secondary | ICD-10-CM | POA: Insufficient documentation

## 2018-04-14 DIAGNOSIS — G309 Alzheimer's disease, unspecified: Secondary | ICD-10-CM | POA: Insufficient documentation

## 2018-04-14 DIAGNOSIS — Z79899 Other long term (current) drug therapy: Secondary | ICD-10-CM | POA: Insufficient documentation

## 2018-04-14 DIAGNOSIS — I10 Essential (primary) hypertension: Secondary | ICD-10-CM | POA: Insufficient documentation

## 2018-04-14 DIAGNOSIS — Y999 Unspecified external cause status: Secondary | ICD-10-CM | POA: Diagnosis not present

## 2018-04-14 DIAGNOSIS — G4489 Other headache syndrome: Secondary | ICD-10-CM | POA: Diagnosis not present

## 2018-04-14 DIAGNOSIS — M549 Dorsalgia, unspecified: Secondary | ICD-10-CM | POA: Diagnosis not present

## 2018-04-14 DIAGNOSIS — S3992XA Unspecified injury of lower back, initial encounter: Secondary | ICD-10-CM | POA: Diagnosis not present

## 2018-04-14 DIAGNOSIS — R52 Pain, unspecified: Secondary | ICD-10-CM | POA: Insufficient documentation

## 2018-04-14 DIAGNOSIS — Z043 Encounter for examination and observation following other accident: Secondary | ICD-10-CM | POA: Diagnosis present

## 2018-04-14 DIAGNOSIS — E119 Type 2 diabetes mellitus without complications: Secondary | ICD-10-CM | POA: Diagnosis not present

## 2018-04-14 DIAGNOSIS — R079 Chest pain, unspecified: Secondary | ICD-10-CM | POA: Diagnosis not present

## 2018-04-14 DIAGNOSIS — W19XXXA Unspecified fall, initial encounter: Secondary | ICD-10-CM

## 2018-04-14 MED ORDER — ACETAMINOPHEN 500 MG PO TABS
1000.0000 mg | ORAL_TABLET | Freq: Once | ORAL | Status: AC
  Administered 2018-04-14: 1000 mg via ORAL
  Filled 2018-04-14: qty 2

## 2018-04-14 NOTE — Discharge Instructions (Addendum)
1.  Patient is seen for an unwitnessed fall.  Pain was difficult to assess due to patient's dementia.  X-rays have been taken of the patient's chest, hip and pelvis, and spine.  No fractures have been identified. 2.  You may give the patient Tylenol every 6 hours as needed for pain.  Continue to observe for any signs of change in basic activity or behavior. 3.  Patient had a recheck with her primary care doctor at the beginning of the week.

## 2018-04-14 NOTE — ED Triage Notes (Signed)
Transported by [employer] from [facility] facility. Unwitnessed fall, (no LOC) and patient was found on her right side. Pt reports head pain; EMS reports no obvious injury. AAO x 2 which is baseline.

## 2018-04-14 NOTE — ED Notes (Signed)
Bed: WA14 Expected date:  Expected time:  Means of arrival:  Comments: EMS-fall 

## 2018-04-14 NOTE — ED Notes (Signed)
[facility] has been notified regarding patient transportation.
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
## 2018-04-14 NOTE — ED Notes (Signed)
Patient transported to X-ray 

## 2018-04-22 DIAGNOSIS — N390 Urinary tract infection, site not specified: Secondary | ICD-10-CM | POA: Diagnosis not present

## 2018-05-04 DIAGNOSIS — J948 Other specified pleural conditions: Secondary | ICD-10-CM | POA: Diagnosis not present

## 2018-05-04 DIAGNOSIS — S2242XD Multiple fractures of ribs, left side, subsequent encounter for fracture with routine healing: Secondary | ICD-10-CM | POA: Diagnosis not present

## 2018-05-26 ENCOUNTER — Encounter (HOSPITAL_COMMUNITY): Payer: Self-pay

## 2018-05-26 ENCOUNTER — Emergency Department (HOSPITAL_COMMUNITY): Payer: Medicare Other

## 2018-05-26 ENCOUNTER — Other Ambulatory Visit: Payer: Self-pay

## 2018-05-26 ENCOUNTER — Emergency Department (HOSPITAL_COMMUNITY)
Admission: EM | Admit: 2018-05-26 | Discharge: 2018-05-27 | Disposition: A | Discharge status: Home / Self Care | Payer: Medicare Other | Attending: Emergency Medicine | Admitting: Emergency Medicine

## 2018-05-26 DIAGNOSIS — S0003XA Contusion of scalp, initial encounter: Secondary | ICD-10-CM

## 2018-05-26 DIAGNOSIS — G308 Other Alzheimer's disease: Secondary | ICD-10-CM | POA: Diagnosis not present

## 2018-05-26 DIAGNOSIS — Y939 Activity, unspecified: Secondary | ICD-10-CM | POA: Diagnosis not present

## 2018-05-26 DIAGNOSIS — Z87891 Personal history of nicotine dependence: Secondary | ICD-10-CM | POA: Diagnosis not present

## 2018-05-26 DIAGNOSIS — F0280 Dementia in other diseases classified elsewhere without behavioral disturbance: Secondary | ICD-10-CM | POA: Diagnosis not present

## 2018-05-26 DIAGNOSIS — Z85038 Personal history of other malignant neoplasm of large intestine: Secondary | ICD-10-CM | POA: Diagnosis not present

## 2018-05-26 DIAGNOSIS — Z79899 Other long term (current) drug therapy: Secondary | ICD-10-CM | POA: Diagnosis not present

## 2018-05-26 DIAGNOSIS — M25512 Pain in left shoulder: Secondary | ICD-10-CM | POA: Diagnosis not present

## 2018-05-26 DIAGNOSIS — I10 Essential (primary) hypertension: Secondary | ICD-10-CM | POA: Insufficient documentation

## 2018-05-26 DIAGNOSIS — W19XXXA Unspecified fall, initial encounter: Secondary | ICD-10-CM | POA: Diagnosis not present

## 2018-05-26 DIAGNOSIS — S199XXA Unspecified injury of neck, initial encounter: Secondary | ICD-10-CM | POA: Diagnosis not present

## 2018-05-26 DIAGNOSIS — F0390 Unspecified dementia without behavioral disturbance: Secondary | ICD-10-CM | POA: Diagnosis not present

## 2018-05-26 DIAGNOSIS — R52 Pain, unspecified: Secondary | ICD-10-CM | POA: Diagnosis not present

## 2018-05-26 DIAGNOSIS — Y92129 Unspecified place in nursing home as the place of occurrence of the external cause: Secondary | ICD-10-CM | POA: Insufficient documentation

## 2018-05-26 DIAGNOSIS — E119 Type 2 diabetes mellitus without complications: Secondary | ICD-10-CM | POA: Insufficient documentation

## 2018-05-26 DIAGNOSIS — M25551 Pain in right hip: Secondary | ICD-10-CM | POA: Diagnosis not present

## 2018-05-26 DIAGNOSIS — S0990XA Unspecified injury of head, initial encounter: Secondary | ICD-10-CM | POA: Diagnosis present

## 2018-05-26 DIAGNOSIS — I959 Hypotension, unspecified: Secondary | ICD-10-CM | POA: Diagnosis not present

## 2018-05-26 DIAGNOSIS — S79911A Unspecified injury of right hip, initial encounter: Secondary | ICD-10-CM | POA: Diagnosis not present

## 2018-05-26 DIAGNOSIS — Y999 Unspecified external cause status: Secondary | ICD-10-CM | POA: Diagnosis not present
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
## 2018-05-26 NOTE — ED Provider Notes (Signed)
[facility] DEPT Provider Note   CSN: [id number] Arrival date & time: 05/26/18  2104     History   Chief Complaint Chief Complaint  Patient presents with  . Fall    HPI Yolanda Kennedy is a 84 y.o. female who presents with a unwitnessed fall.  Past medical history significant for Alzheimer's, history of colon cancer, diabetes, hypertension, hyperlipidemia.  She currently resides at [facility]  The med tech at [facility] states that the patient was in her wheelchair tonight in the hall.  The patient had an an unwitnessed fall and was found on the floor.  They think it may be due to the patient trying to get up out of the wheelchair assisted.  The patient denies any pain.  She tells me that she fell when she was trying to put plates back in the cupboard.  She is able to tell me her name and the hospital.  Level 5 caveat due to dementia  HPI  Past Medical History:  Diagnosis Date  . Alzheimer's disease   . Colon cancer ([place])   . Dementia   . Diabetes mellitus without complication ([place])   . Hyperlipidemia   . Hypertension     Patient Active Problem List   Diagnosis Date Noted  . Acute encephalopathy 06/15/2016  . UTI (lower urinary tract infection) 06/15/2016  . Fall 06/15/2016  . Essential hypertension 06/15/2016  . Diabetes mellitus with complication ([place]) 95/62/1308  . Dementia 06/15/2016  . Facial contusion     Past Surgical History:  Procedure Laterality Date  . CHOLECYSTECTOMY       OB History   None      Home Medications    Prior to Admission medications   Medication Sig Start Date End Date Taking? Authorizing Provider  Cholecalciferol (VITAMIN D) 2000 units CAPS Take 1 capsule by mouth daily.    [provider]  cloNIDine (CATAPRES) 0.1 MG tablet Take 0.1 mg by mouth every 8 (eight) hours as needed (for systolic pressure greater than 180).    [provider]  Dextromethorphan-Guaifenesin 10-200  MG/5ML LIQD Take 5 mLs by mouth every 4 (four) hours as needed (for cough).     [provider]  furosemide (LASIX) 20 MG tablet Take 20 mg by mouth daily with breakfast.     [provider]  geriatric multivitamins-minerals (ELDERTONIC/GEVRABON) ELIX Take 15 mLs by mouth 2 (two) times daily.    [provider]  lisinopril (PRINIVIL,ZESTRIL) 40 MG tablet Take 40 mg by mouth daily with breakfast.    [provider]  loperamide (IMODIUM A-D) 2 MG tablet Take 2 mg by mouth as needed for diarrhea or loose stools (one table orally as needed after each loose stool.  Max 8 tabs per 24 hours).    [provider]  metoprolol tartrate (LOPRESSOR) 25 MG tablet Take 25 mg by mouth daily with breakfast.     [provider]  mirtazapine (REMERON) 15 MG tablet Take 15 mg by mouth at bedtime.    [provider]  QUEtiapine (SEROQUEL) 50 MG tablet Take 50 mg by mouth at bedtime.    [provider]  senna (SENOKOT) 8.6 MG TABS tablet Take 1 tablet by mouth 2 (two) times daily.    [provider]  traMADol (ULTRAM) 50 MG tablet Take 50 mg by mouth every 8 (eight) hours as needed (for pain).     [provider]  traZODone (DESYREL) 100 MG tablet  Take 100 mg by mouth at bedtime.    [provider]    Family History Family History  Family history unknown: Yes    Social History Social History   Tobacco Use  . Smoking status: Former Research scientist (life sciences)  . Smokeless tobacco: Former Systems developer    Types: Chew  Substance Use Topics  . Alcohol use: No    Alcohol/week: 0.0 oz  . Drug use: No     Allergies   Patient has no known allergies.   Review of Systems Review of Systems  Unable to perform ROS: Dementia     Physical Exam Updated Vital Signs BP (!) 189/83   Pulse 73   Resp 15   SpO2 97%   Physical Exam  Constitutional: She is oriented to person, place, and time. She appears well-developed and well-nourished. No  distress.  Calm and cooperative.  Alert and pleasantly confused  HENT:  Head: Normocephalic.  Small hematoma over the right posterior scalp  Eyes: Pupils are equal, round, and reactive to light. Conjunctivae are normal. Right eye exhibits no discharge. Left eye exhibits no discharge. No scleral icterus.  Neck: Normal range of motion.  No midline tenderness  Cardiovascular: Normal rate and regular rhythm.  Pulmonary/Chest: Effort normal and breath sounds normal. No respiratory distress.  Abdominal: Bowel sounds are normal. She exhibits no distension. There is no tenderness.  Musculoskeletal:  2+ peripheral edema bilaterally  Normal range of motion of the right upper extremity  Pain with range of motion of the left shoulder.  Neurological: She is alert and oriented to person, place, and time.  Skin: Skin is warm and dry.  Psychiatric: She has a normal mood and affect. Her behavior is normal.  Nursing note and vitals reviewed.    ED Treatments / Results  Labs (all labs ordered are listed, but only abnormal results are displayed) Labs Reviewed - No data to display  EKG None  Radiology Ct Head Wo Contrast  Result Date: 05/26/2018 CLINICAL DATA:  Fall with head impact. EXAM: CT HEAD WITHOUT CONTRAST CT CERVICAL SPINE WITHOUT CONTRAST TECHNIQUE: Multidetector CT imaging of the head and cervical spine was performed following the standard protocol without intravenous contrast. Multiplanar CT image reconstructions of the cervical spine were also generated. COMPARISON:  CT head and cervical spine 09/15/2017 FINDINGS: CT HEAD FINDINGS Brain: There is no mass, hemorrhage or extra-axial collection. There is generalized atrophy without lobar predilection. There is no acute or chronic infarction. There is hypoattenuation of the periventricular white matter, most commonly indicating chronic ischemic microangiopathy. Vascular: No abnormal hyperdensity of the major intracranial arteries or dural  venous sinuses. No intracranial atherosclerosis. Skull: Small right parietal scalp hematoma.  No skull fracture. Sinuses/Orbits: No fluid levels or advanced mucosal thickening of the visualized paranasal sinuses. No mastoid or middle ear effusion. The orbits are normal. CT CERVICAL SPINE FINDINGS Alignment: No static subluxation. Facets are aligned. Occipital condyles are normally positioned. Skull base and vertebrae: No acute fracture. Soft tissues and spinal canal: No prevertebral fluid or swelling. No visible canal hematoma. Disc levels: Severe bilateral C1-C2 uncovertebral hypertrophy. Multilevel disc space narrowing. No bony spinal canal stenosis. Upper chest: Unchanged 6 mm right upper lobe nodule compared to 09/15/2017. Other: Normal visualized paraspinal cervical soft tissues. IMPRESSION: 1. No acute intracranial abnormality. 2. No acute fracture or static subluxation of the cervical spine. 3. Small right parietal scalp hematoma without skull fracture. 4. Chronic small vessel disease and generalized brain volume loss. Electronically Signed   By: Lennette Bihari  Collins [place] M.D.   On: 05/26/2018 22:38   Ct Cervical Spine Wo Contrast  Result Date: 05/26/2018 CLINICAL DATA:  Fall with head impact. EXAM: CT HEAD WITHOUT CONTRAST CT CERVICAL SPINE WITHOUT CONTRAST TECHNIQUE: Multidetector CT imaging of the head and cervical spine was performed following the standard protocol without intravenous contrast. Multiplanar CT image reconstructions of the cervical spine were also generated. COMPARISON:  CT head and cervical spine 09/15/2017 FINDINGS: CT HEAD FINDINGS Brain: There is no mass, hemorrhage or extra-axial collection. There is generalized atrophy without lobar predilection. There is no acute or chronic infarction. There is hypoattenuation of the periventricular white matter, most commonly indicating chronic ischemic microangiopathy. Vascular: No abnormal hyperdensity of the major intracranial arteries or dural venous  sinuses. No intracranial atherosclerosis. Skull: Small right parietal scalp hematoma.  No skull fracture. Sinuses/Orbits: No fluid levels or advanced mucosal thickening of the visualized paranasal sinuses. No mastoid or middle ear effusion. The orbits are normal. CT CERVICAL SPINE FINDINGS Alignment: No static subluxation. Facets are aligned. Occipital condyles are normally positioned. Skull base and vertebrae: No acute fracture. Soft tissues and spinal canal: No prevertebral fluid or swelling. No visible canal hematoma. Disc levels: Severe bilateral C1-C2 uncovertebral hypertrophy. Multilevel disc space narrowing. No bony spinal canal stenosis. Upper chest: Unchanged 6 mm right upper lobe nodule compared to 09/15/2017. Other: Normal visualized paraspinal cervical soft tissues. IMPRESSION: 1. No acute intracranial abnormality. 2. No acute fracture or static subluxation of the cervical spine. 3. Small right parietal scalp hematoma without skull fracture. 4. Chronic small vessel disease and generalized brain volume loss. Electronically Signed   By: Ulyses Jarred M.D.   On: 05/26/2018 22:38   Dg Shoulder Left Portable  Result Date: 05/26/2018 CLINICAL DATA:  Patient fell.  Left shoulder pain. EXAM: LEFT SHOULDER - 1 VIEW COMPARISON:  Left humerus radiographs 06/15/2016 FINDINGS: Slight irregularity at the base of the glenoid appears chronic and stable relative to 2017. Subtle fracture is not entirely excluded but given similar appearance, is believed less likely. Osteopenic appearance of the bones make assessment more challenging. If the patient has pain referable to the shoulder, CT may help for better assessment. The [facility] and glenohumeral joints appear intact. The adjacent ribs and lung are nonacute apart from mild interstitial edema. IMPRESSION: Osteopenic appearance of the left shoulder. No acute fracture is identified. Slight irregularity at the base of the glenoid is stable in appearance relative to 2017. Given  osteopenic appearance of the bones, a subtle fracture cannot be entirely excluded. If clinically correlated for pain, CT may help. Electronically Signed   By: Ashley Royalty M.D.   On: 05/26/2018 22:51    Procedures Procedures (including critical care time)  Medications Ordered in ED Medications - No data to display   Initial Impression / Assessment and Plan / ED Course  I have reviewed the triage vital signs and the nursing notes.  Pertinent labs & imaging results that were available during my care of the patient were reviewed by me and considered in my medical decision making (see chart for details).  84 year old female presents with an unwitnessed fall.  She is hypertensive but otherwise vital signs are normal.  She has a scalp hematoma on exam with no other obvious signs of injury.  Will obtain CT head, C-spine, left shoulder x-ray  Imaging is overall negative other than degenerative changes.    Pt was also evaluated by Dr. Tamera Punt. She had some R hip pain. Will order R hip xray. Care transferred  to S [place] PA-C  Final Clinical Impressions(s) / ED Diagnoses   Final diagnoses:  Hematoma of scalp, initial encounter  Fall, initial encounter    ED Discharge Orders    None       Recardo Evangelist, PA-C 05/27/18 0008    Malvin Johns, MD 05/27/18 [phone]

## 2018-05-26 NOTE — ED Triage Notes (Addendum)
Pt presents to ED via EMS for fall and living facility. Pt is a resident in a memory care facility. Pt has no complaints at this time, but EMS reports a "bump" on pt's head. Pt not on blood thinners.

## 2018-05-27 ENCOUNTER — Emergency Department (HOSPITAL_COMMUNITY): Payer: Medicare Other

## 2018-05-27 DIAGNOSIS — S79911A Unspecified injury of right hip, initial encounter: Secondary | ICD-10-CM | POA: Diagnosis not present

## 2018-05-27 DIAGNOSIS — S0003XA Contusion of scalp, initial encounter: Secondary | ICD-10-CM | POA: Diagnosis not present

## 2018-05-27 DIAGNOSIS — M25551 Pain in right hip: Secondary | ICD-10-CM | POA: Diagnosis not present

## 2018-05-27 NOTE — ED Notes (Signed)
Pt incontinent of large amount of urine. Pt changed and new brief placed. Pt off to xray.

## 2018-05-27 NOTE — ED Provider Notes (Signed)
Patient signed out at end of shift by Janetta Hora, PA-C, with hip x-ray pending in evaluation of unwitnessed fall at her nursing home. The patient has dementia and is an unreliable historian. Pain complaints change on multiple re-evaluations, causing hip film to be added at end of evaluation.   Per nursing, the patient was undressed to clean her from urinary incontinence and nursing voiced concern for left thigh redness and ?swelling. On exam, the left thigh has minimal swelling. Erythema appears to be generalized, including right thigh. There is no tenderness to this area. She moves with leg without difficulty or limitation.   Hip/pelvic x-ray negative for fracture. She can be discharged home to [facility] per plan of previous treatment team.    Charlann Lange, PA-C 05/27/18 0154    Malvin Johns, MD 05/27/18 [phone]

## 2018-05-27 NOTE — Discharge Instructions (Addendum)
The patient can be discharged back to the memory care facility. No significant injury was identified.

## 2018-05-29 DIAGNOSIS — F5105 Insomnia due to other mental disorder: Secondary | ICD-10-CM | POA: Diagnosis not present

## 2018-05-29 DIAGNOSIS — F0280 Dementia in other diseases classified elsewhere without behavioral disturbance: Secondary | ICD-10-CM | POA: Diagnosis not present

## 2018-05-29 DIAGNOSIS — F064 Anxiety disorder due to known physiological condition: Secondary | ICD-10-CM | POA: Diagnosis not present

## 2018-05-29 DIAGNOSIS — G301 Alzheimer's disease with late onset: Secondary | ICD-10-CM | POA: Diagnosis not present

## 2018-06-03 DIAGNOSIS — J948 Other specified pleural conditions: Secondary | ICD-10-CM | POA: Diagnosis not present

## 2018-06-03 DIAGNOSIS — S2242XD Multiple fractures of ribs, left side, subsequent encounter for fracture with routine healing: Secondary | ICD-10-CM | POA: Diagnosis not present

## 2018-06-09 DIAGNOSIS — F0280 Dementia in other diseases classified elsewhere without behavioral disturbance: Secondary | ICD-10-CM | POA: Diagnosis not present

## 2018-06-09 DIAGNOSIS — Z9181 History of falling: Secondary | ICD-10-CM | POA: Diagnosis not present

## 2018-06-09 DIAGNOSIS — F5105 Insomnia due to other mental disorder: Secondary | ICD-10-CM | POA: Diagnosis not present

## 2018-06-09 DIAGNOSIS — C189 Malignant neoplasm of colon, unspecified: Secondary | ICD-10-CM | POA: Diagnosis not present

## 2018-06-09 DIAGNOSIS — I10 Essential (primary) hypertension: Secondary | ICD-10-CM | POA: Diagnosis not present

## 2018-06-09 DIAGNOSIS — F064 Anxiety disorder due to known physiological condition: Secondary | ICD-10-CM | POA: Diagnosis not present

## 2018-06-09 DIAGNOSIS — F329 Major depressive disorder, single episode, unspecified: Secondary | ICD-10-CM | POA: Diagnosis not present

## 2018-06-09 DIAGNOSIS — E119 Type 2 diabetes mellitus without complications: Secondary | ICD-10-CM | POA: Diagnosis not present

## 2018-06-09 DIAGNOSIS — G309 Alzheimer's disease, unspecified: Secondary | ICD-10-CM | POA: Diagnosis not present

## 2018-06-09 DIAGNOSIS — E785 Hyperlipidemia, unspecified: Secondary | ICD-10-CM | POA: Diagnosis not present

## 2018-06-27 DIAGNOSIS — B351 Tinea unguium: Secondary | ICD-10-CM | POA: Diagnosis not present

## 2018-06-27 DIAGNOSIS — I739 Peripheral vascular disease, unspecified: Secondary | ICD-10-CM | POA: Diagnosis not present

## 2018-06-27 DIAGNOSIS — L603 Nail dystrophy: Secondary | ICD-10-CM | POA: Diagnosis not present

## 2018-06-27 DIAGNOSIS — Q845 Enlarged and hypertrophic nails: Secondary | ICD-10-CM | POA: Diagnosis not present

## 2018-07-04 DIAGNOSIS — J948 Other specified pleural conditions: Secondary | ICD-10-CM | POA: Diagnosis not present

## 2018-07-04 DIAGNOSIS — S2242XD Multiple fractures of ribs, left side, subsequent encounter for fracture with routine healing: Secondary | ICD-10-CM | POA: Diagnosis not present

## 2018-07-13 DIAGNOSIS — I10 Essential (primary) hypertension: Secondary | ICD-10-CM | POA: Diagnosis not present

## 2018-08-04 DIAGNOSIS — S2242XD Multiple fractures of ribs, left side, subsequent encounter for fracture with routine healing: Secondary | ICD-10-CM | POA: Diagnosis not present

## 2018-08-04 DIAGNOSIS — J948 Other specified pleural conditions: Secondary | ICD-10-CM | POA: Diagnosis not present

## 2018-08-16 DIAGNOSIS — E559 Vitamin D deficiency, unspecified: Secondary | ICD-10-CM | POA: Diagnosis not present

## 2018-08-16 DIAGNOSIS — E119 Type 2 diabetes mellitus without complications: Secondary | ICD-10-CM | POA: Diagnosis not present

## 2018-08-16 DIAGNOSIS — G894 Chronic pain syndrome: Secondary | ICD-10-CM | POA: Diagnosis not present

## 2018-08-16 DIAGNOSIS — R634 Abnormal weight loss: Secondary | ICD-10-CM | POA: Diagnosis not present

## 2018-09-03 DIAGNOSIS — S2242XD Multiple fractures of ribs, left side, subsequent encounter for fracture with routine healing: Secondary | ICD-10-CM | POA: Diagnosis not present

## 2018-09-03 DIAGNOSIS — J948 Other specified pleural conditions: Secondary | ICD-10-CM | POA: Diagnosis not present

## 2018-09-06 DIAGNOSIS — R05 Cough: Secondary | ICD-10-CM | POA: Diagnosis not present

## 2018-09-06 DIAGNOSIS — R600 Localized edema: Secondary | ICD-10-CM | POA: Diagnosis not present

## 2018-09-20 DIAGNOSIS — K5909 Other constipation: Secondary | ICD-10-CM | POA: Diagnosis not present

## 2018-09-20 DIAGNOSIS — R05 Cough: Secondary | ICD-10-CM | POA: Diagnosis not present

## 2018-09-20 DIAGNOSIS — R600 Localized edema: Secondary | ICD-10-CM | POA: Diagnosis not present

## 2018-09-20 DIAGNOSIS — R634 Abnormal weight loss: Secondary | ICD-10-CM | POA: Diagnosis not present

## 2018-09-28 DIAGNOSIS — F064 Anxiety disorder due to known physiological condition: Secondary | ICD-10-CM | POA: Diagnosis not present

## 2018-09-28 DIAGNOSIS — R05 Cough: Secondary | ICD-10-CM | POA: Diagnosis not present

## 2018-09-28 DIAGNOSIS — R634 Abnormal weight loss: Secondary | ICD-10-CM | POA: Diagnosis not present

## 2018-09-28 DIAGNOSIS — K5909 Other constipation: Secondary | ICD-10-CM | POA: Diagnosis not present

## 2018-09-28 DIAGNOSIS — E119 Type 2 diabetes mellitus without complications: Secondary | ICD-10-CM | POA: Diagnosis not present

## 2018-09-28 DIAGNOSIS — R600 Localized edema: Secondary | ICD-10-CM | POA: Diagnosis not present

## 2018-09-28 DIAGNOSIS — G301 Alzheimer's disease with late onset: Secondary | ICD-10-CM | POA: Diagnosis not present

## 2018-09-28 DIAGNOSIS — F0280 Dementia in other diseases classified elsewhere without behavioral disturbance: Secondary | ICD-10-CM | POA: Diagnosis not present

## 2018-10-04 DIAGNOSIS — J948 Other specified pleural conditions: Secondary | ICD-10-CM | POA: Diagnosis not present

## 2018-10-04 DIAGNOSIS — S2242XD Multiple fractures of ribs, left side, subsequent encounter for fracture with routine healing: Secondary | ICD-10-CM | POA: Diagnosis not present

## 2018-10-18 DIAGNOSIS — R32 Unspecified urinary incontinence: Secondary | ICD-10-CM | POA: Diagnosis not present

## 2018-10-18 DIAGNOSIS — R634 Abnormal weight loss: Secondary | ICD-10-CM | POA: Diagnosis not present

## 2018-10-18 DIAGNOSIS — H5340 Unspecified visual field defects: Secondary | ICD-10-CM | POA: Diagnosis not present

## 2018-10-18 DIAGNOSIS — R600 Localized edema: Secondary | ICD-10-CM | POA: Diagnosis not present

## 2018-10-22 DIAGNOSIS — R0902 Hypoxemia: Secondary | ICD-10-CM | POA: Diagnosis not present

## 2018-10-22 DIAGNOSIS — S0990XA Unspecified injury of head, initial encounter: Secondary | ICD-10-CM | POA: Diagnosis not present

## 2018-10-22 DIAGNOSIS — Y998 Other external cause status: Secondary | ICD-10-CM | POA: Diagnosis not present

## 2018-10-22 DIAGNOSIS — S60222A Contusion of left hand, initial encounter: Secondary | ICD-10-CM | POA: Diagnosis not present

## 2018-10-22 DIAGNOSIS — S63502A Unspecified sprain of left wrist, initial encounter: Secondary | ICD-10-CM | POA: Diagnosis not present

## 2018-10-22 DIAGNOSIS — W19XXXA Unspecified fall, initial encounter: Secondary | ICD-10-CM | POA: Diagnosis not present

## 2018-10-22 DIAGNOSIS — I10 Essential (primary) hypertension: Secondary | ICD-10-CM | POA: Diagnosis not present

## 2018-10-22 DIAGNOSIS — R609 Edema, unspecified: Secondary | ICD-10-CM | POA: Diagnosis not present

## 2018-10-22 DIAGNOSIS — R51 Headache: Secondary | ICD-10-CM | POA: Diagnosis not present

## 2018-10-25 DIAGNOSIS — E782 Mixed hyperlipidemia: Secondary | ICD-10-CM | POA: Diagnosis not present

## 2018-10-25 DIAGNOSIS — E039 Hypothyroidism, unspecified: Secondary | ICD-10-CM | POA: Diagnosis not present

## 2018-10-25 DIAGNOSIS — I10 Essential (primary) hypertension: Secondary | ICD-10-CM | POA: Diagnosis not present

## 2018-10-28 DIAGNOSIS — E119 Type 2 diabetes mellitus without complications: Secondary | ICD-10-CM | POA: Diagnosis not present

## 2018-10-28 DIAGNOSIS — F0280 Dementia in other diseases classified elsewhere without behavioral disturbance: Secondary | ICD-10-CM | POA: Diagnosis not present

## 2018-10-28 DIAGNOSIS — F064 Anxiety disorder due to known physiological condition: Secondary | ICD-10-CM | POA: Diagnosis not present

## 2018-10-28 DIAGNOSIS — G301 Alzheimer's disease with late onset: Secondary | ICD-10-CM | POA: Diagnosis not present

## 2018-11-01 DIAGNOSIS — R4182 Altered mental status, unspecified: Secondary | ICD-10-CM | POA: Diagnosis not present

## 2018-11-01 DIAGNOSIS — B379 Candidiasis, unspecified: Secondary | ICD-10-CM | POA: Diagnosis not present

## 2018-11-01 DIAGNOSIS — W1839XA Other fall on same level, initial encounter: Secondary | ICD-10-CM | POA: Diagnosis not present

## 2018-11-03 DIAGNOSIS — J948 Other specified pleural conditions: Secondary | ICD-10-CM | POA: Diagnosis not present

## 2018-11-03 DIAGNOSIS — S2242XD Multiple fractures of ribs, left side, subsequent encounter for fracture with routine healing: Secondary | ICD-10-CM | POA: Diagnosis not present

## 2018-11-08 DIAGNOSIS — D649 Anemia, unspecified: Secondary | ICD-10-CM | POA: Diagnosis not present

## 2018-11-13 DIAGNOSIS — G301 Alzheimer's disease with late onset: Secondary | ICD-10-CM | POA: Diagnosis not present

## 2018-11-13 DIAGNOSIS — F0280 Dementia in other diseases classified elsewhere without behavioral disturbance: Secondary | ICD-10-CM | POA: Diagnosis not present

## 2018-11-13 DIAGNOSIS — F5105 Insomnia due to other mental disorder: Secondary | ICD-10-CM | POA: Diagnosis not present

## 2018-11-13 DIAGNOSIS — F064 Anxiety disorder due to known physiological condition: Secondary | ICD-10-CM | POA: Diagnosis not present

## 2018-11-15 DIAGNOSIS — G894 Chronic pain syndrome: Secondary | ICD-10-CM | POA: Diagnosis not present

## 2018-11-15 DIAGNOSIS — R4182 Altered mental status, unspecified: Secondary | ICD-10-CM | POA: Diagnosis not present

## 2018-11-15 DIAGNOSIS — I10 Essential (primary) hypertension: Secondary | ICD-10-CM | POA: Diagnosis not present

## 2018-11-15 DIAGNOSIS — E559 Vitamin D deficiency, unspecified: Secondary | ICD-10-CM | POA: Diagnosis not present

## 2018-11-28 DIAGNOSIS — F0280 Dementia in other diseases classified elsewhere without behavioral disturbance: Secondary | ICD-10-CM | POA: Diagnosis not present

## 2018-11-28 DIAGNOSIS — E119 Type 2 diabetes mellitus without complications: Secondary | ICD-10-CM | POA: Diagnosis not present

## 2018-11-28 DIAGNOSIS — G301 Alzheimer's disease with late onset: Secondary | ICD-10-CM | POA: Diagnosis not present

## 2018-11-28 DIAGNOSIS — F064 Anxiety disorder due to known physiological condition: Secondary | ICD-10-CM | POA: Diagnosis not present

## 2018-12-11 DIAGNOSIS — F5105 Insomnia due to other mental disorder: Secondary | ICD-10-CM | POA: Diagnosis not present

## 2018-12-11 DIAGNOSIS — G301 Alzheimer's disease with late onset: Secondary | ICD-10-CM | POA: Diagnosis not present

## 2018-12-11 DIAGNOSIS — F064 Anxiety disorder due to known physiological condition: Secondary | ICD-10-CM | POA: Diagnosis not present

## 2018-12-11 DIAGNOSIS — F0280 Dementia in other diseases classified elsewhere without behavioral disturbance: Secondary | ICD-10-CM | POA: Diagnosis not present

## 2018-12-20 DIAGNOSIS — R2689 Other abnormalities of gait and mobility: Secondary | ICD-10-CM | POA: Diagnosis not present

## 2018-12-20 DIAGNOSIS — Z0000 Encounter for general adult medical examination without abnormal findings: Secondary | ICD-10-CM | POA: Diagnosis not present

## 2018-12-20 DIAGNOSIS — M6281 Muscle weakness (generalized): Secondary | ICD-10-CM | POA: Diagnosis not present

## 2018-12-29 DIAGNOSIS — G301 Alzheimer's disease with late onset: Secondary | ICD-10-CM | POA: Diagnosis not present

## 2018-12-29 DIAGNOSIS — I10 Essential (primary) hypertension: Secondary | ICD-10-CM | POA: Diagnosis not present

## 2018-12-29 DIAGNOSIS — G894 Chronic pain syndrome: Secondary | ICD-10-CM | POA: Diagnosis not present

## 2018-12-29 DIAGNOSIS — E119 Type 2 diabetes mellitus without complications: Secondary | ICD-10-CM | POA: Diagnosis not present

## 2019-01-08 IMAGING — CR DG THORACIC SPINE 2V
3 series · 3 of 3 positions shown · non-contrast
Comparison: 11/14/2017

CLINICAL DATA: Recent fall today with chest pain, initial encounter

EXAM:
THORACIC SPINE 2 VIEWS

[t thoracic spine ap]
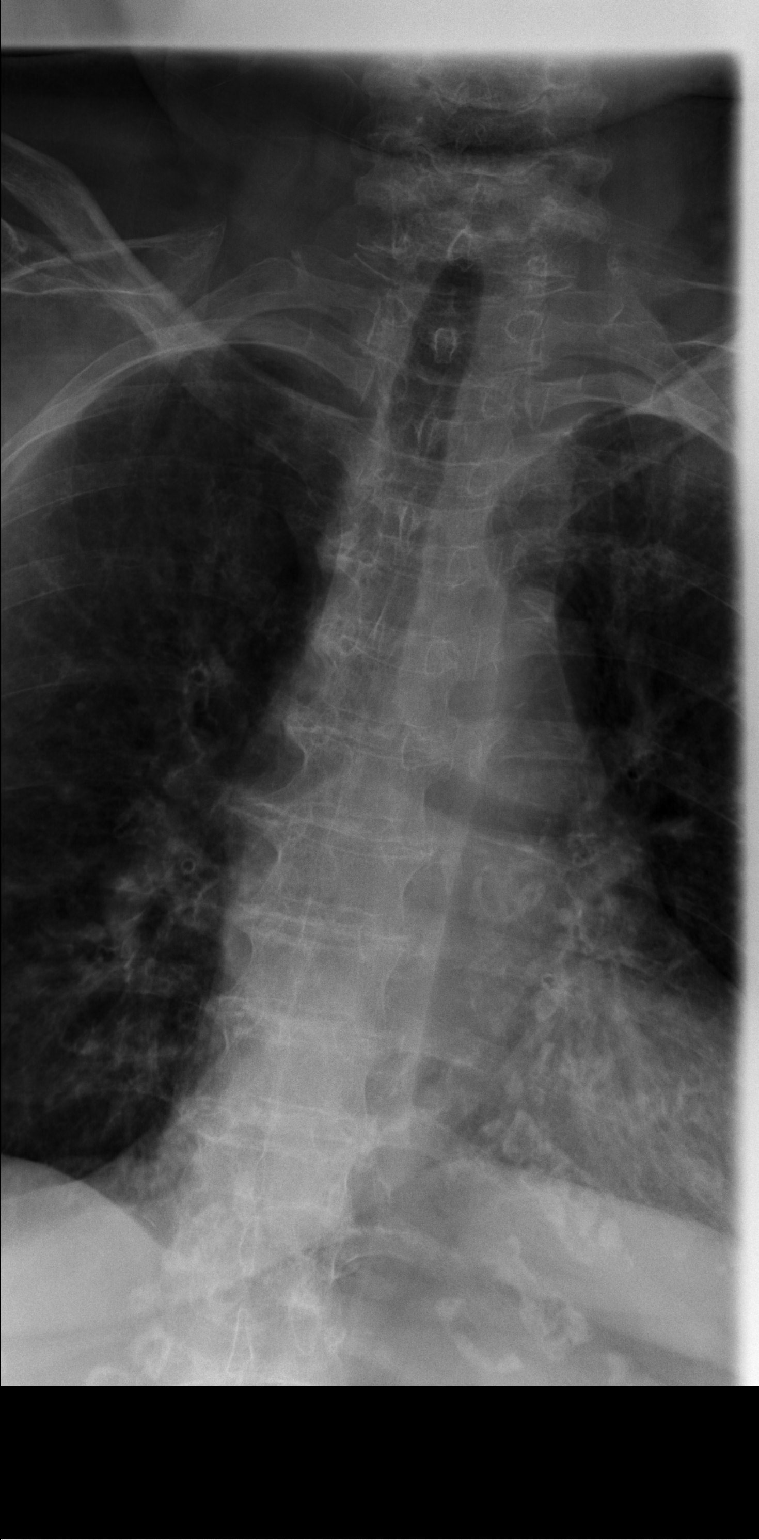

[t thoracic spine lat]
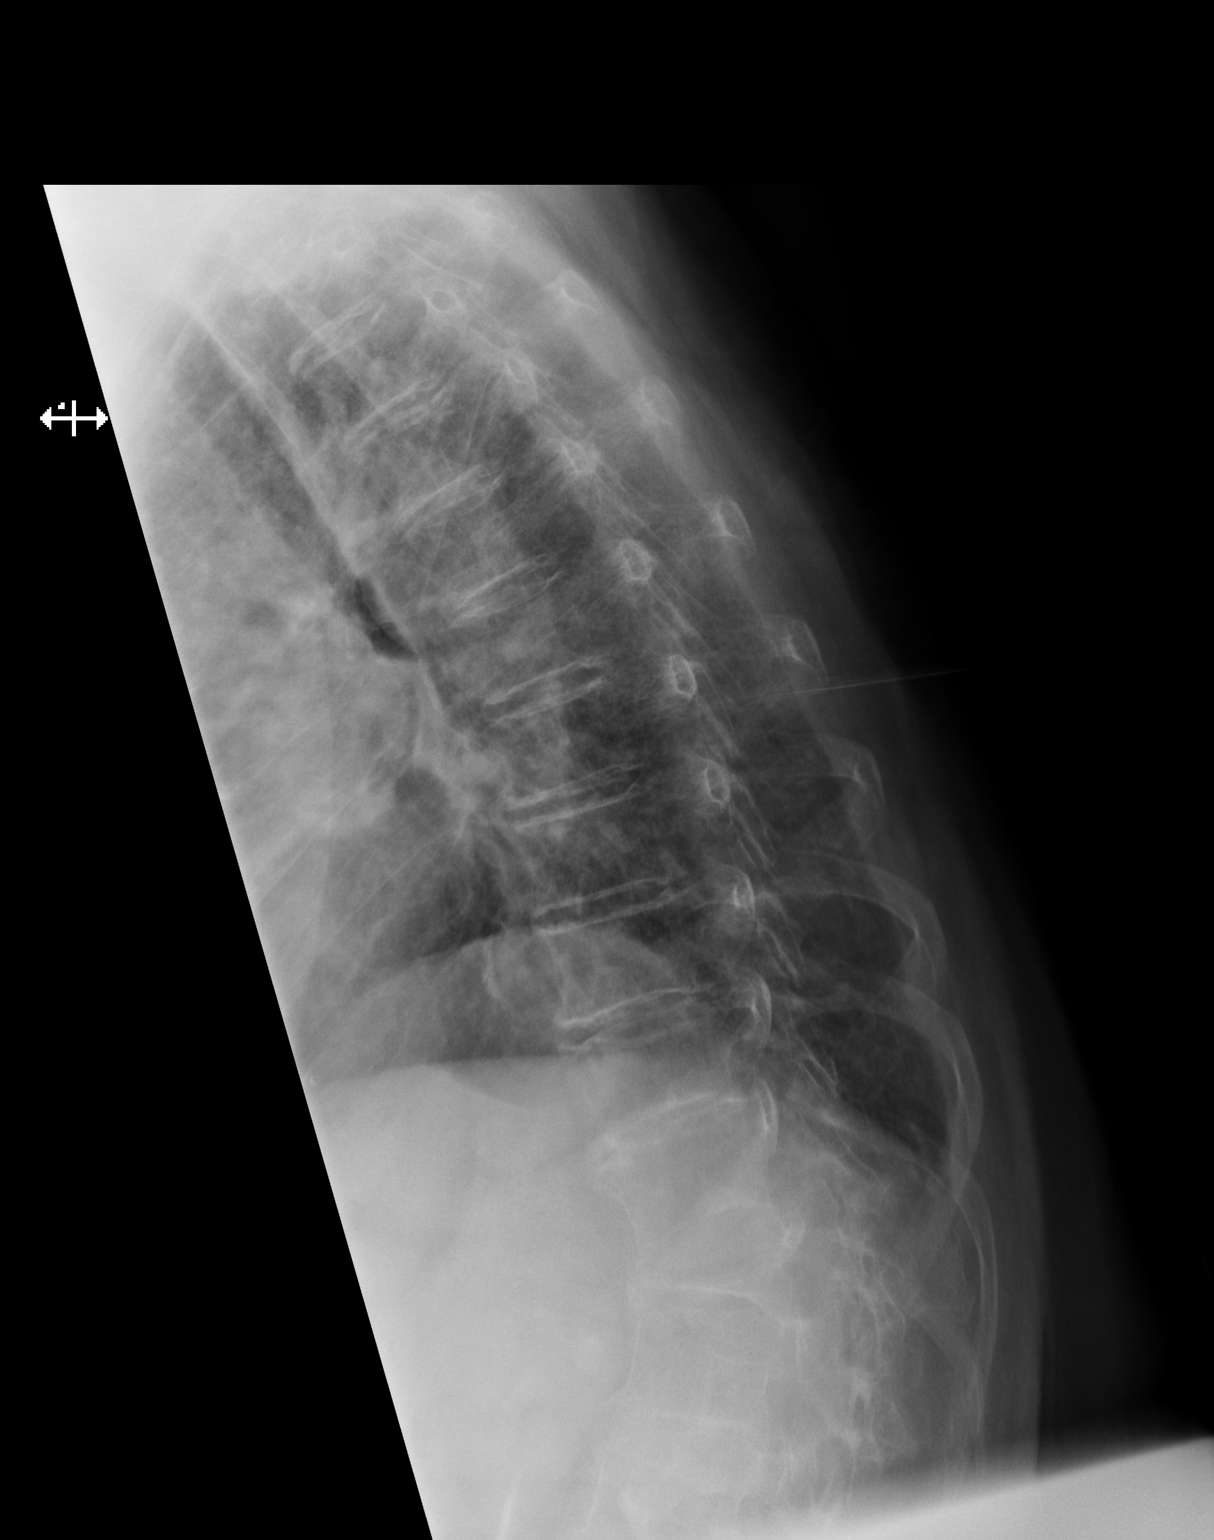

[t thoracic swimmers]
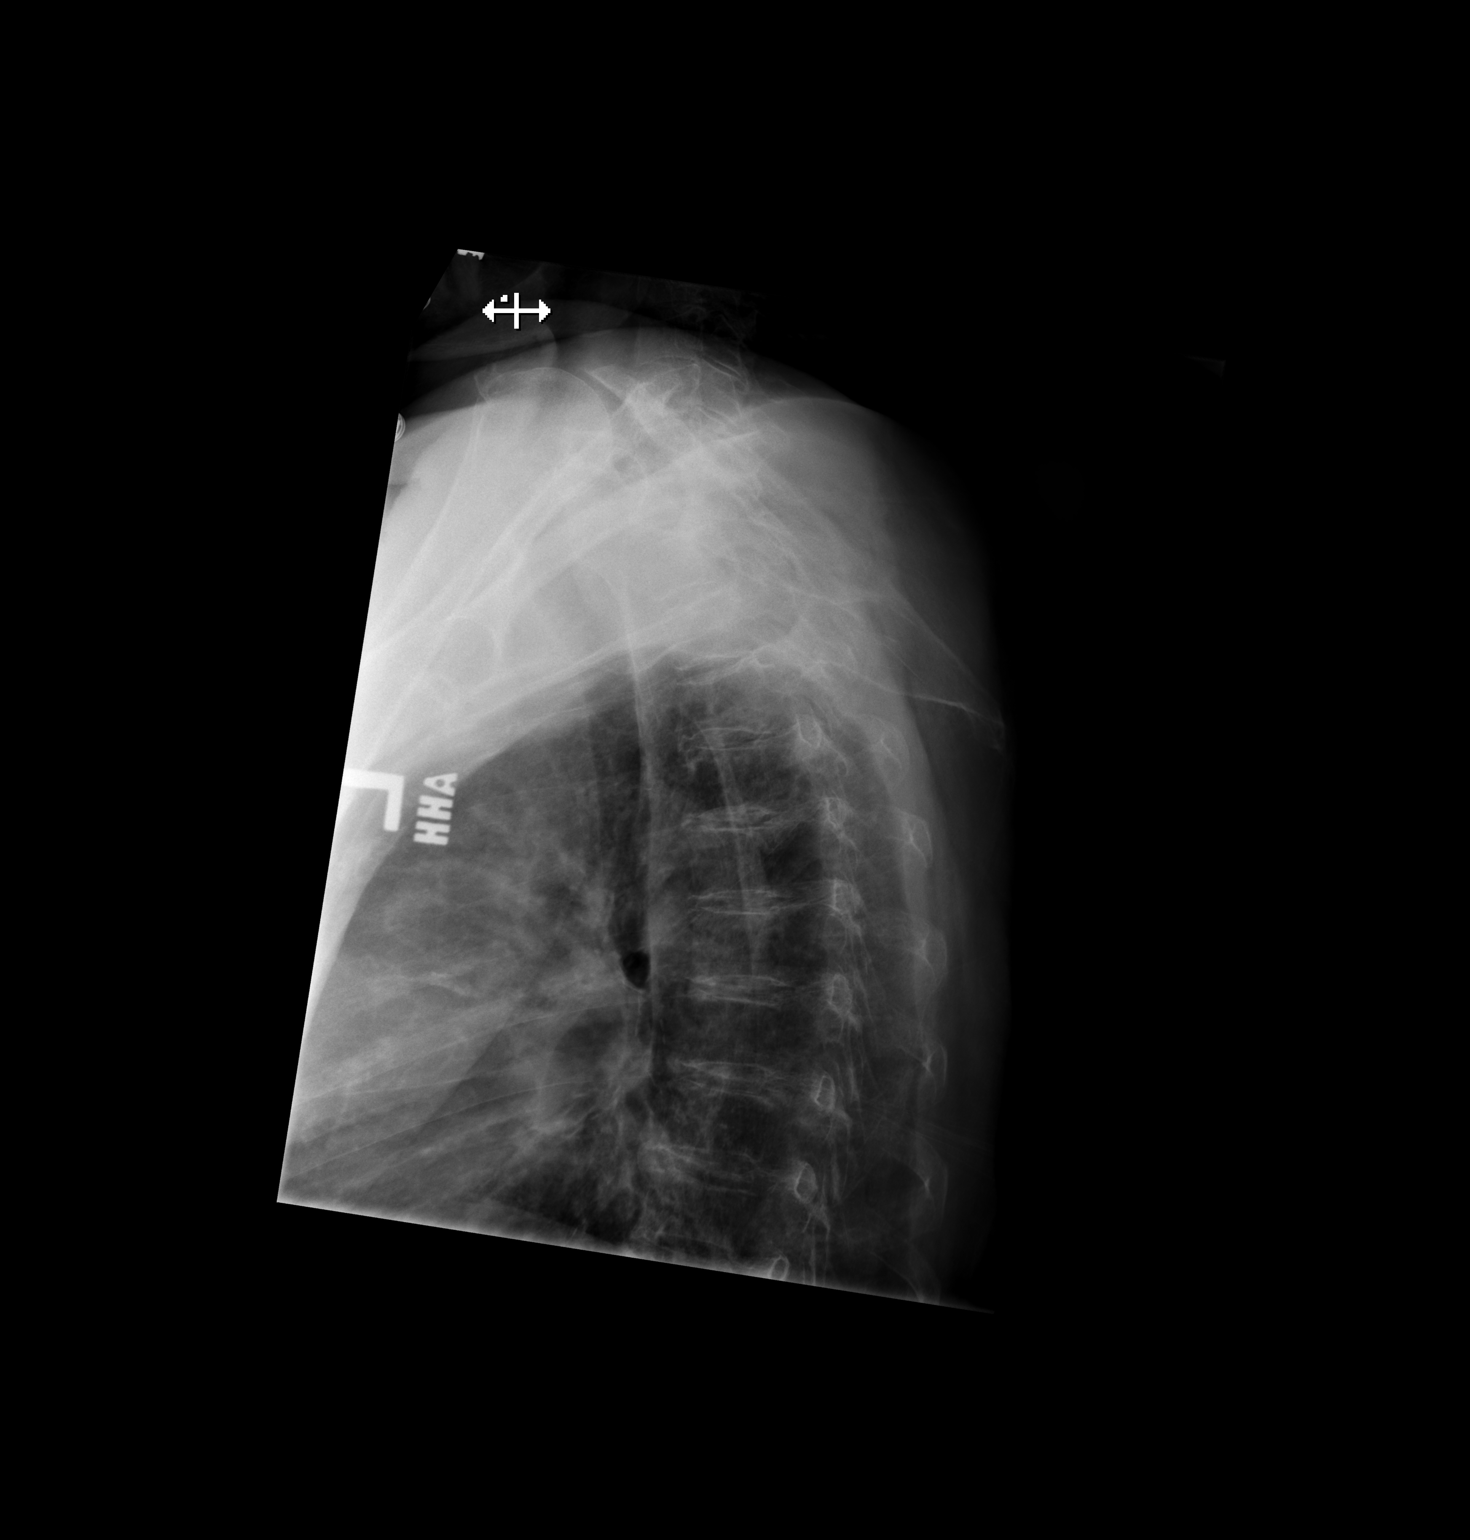

[3 of 3 positions shown; findings below may reference images not displayed]

FINDINGS: Vertebral body height is well maintained with the exception of T12
which demonstrates evidence of vertebral plana this is chronic in
appearance seen on prior CT examination from 11/14/2017. The
pedicles are within normal limits and no paraspinal mass lesion is
noted.
IMPRESSION: Chronic T12 compression deformity.  No acute abnormality noted.

## 2019-01-10 DIAGNOSIS — I10 Essential (primary) hypertension: Secondary | ICD-10-CM | POA: Diagnosis not present

## 2019-01-10 DIAGNOSIS — G894 Chronic pain syndrome: Secondary | ICD-10-CM | POA: Diagnosis not present

## 2019-01-10 DIAGNOSIS — E559 Vitamin D deficiency, unspecified: Secondary | ICD-10-CM | POA: Diagnosis not present

## 2019-01-10 DIAGNOSIS — R4182 Altered mental status, unspecified: Secondary | ICD-10-CM | POA: Diagnosis not present

## 2019-01-11 DIAGNOSIS — F419 Anxiety disorder, unspecified: Secondary | ICD-10-CM | POA: Diagnosis not present

## 2019-01-11 DIAGNOSIS — R05 Cough: Secondary | ICD-10-CM | POA: Diagnosis not present

## 2019-01-11 DIAGNOSIS — G4700 Insomnia, unspecified: Secondary | ICD-10-CM | POA: Diagnosis not present

## 2019-01-11 DIAGNOSIS — Z7984 Long term (current) use of oral hypoglycemic drugs: Secondary | ICD-10-CM | POA: Diagnosis not present

## 2019-01-11 DIAGNOSIS — G309 Alzheimer's disease, unspecified: Secondary | ICD-10-CM | POA: Diagnosis not present

## 2019-01-11 DIAGNOSIS — F0280 Dementia in other diseases classified elsewhere without behavioral disturbance: Secondary | ICD-10-CM | POA: Diagnosis not present

## 2019-01-11 DIAGNOSIS — E119 Type 2 diabetes mellitus without complications: Secondary | ICD-10-CM | POA: Diagnosis not present

## 2019-01-11 DIAGNOSIS — F329 Major depressive disorder, single episode, unspecified: Secondary | ICD-10-CM | POA: Diagnosis not present

## 2019-01-11 DIAGNOSIS — C189 Malignant neoplasm of colon, unspecified: Secondary | ICD-10-CM | POA: Diagnosis not present

## 2019-01-17 DIAGNOSIS — R600 Localized edema: Secondary | ICD-10-CM | POA: Diagnosis not present

## 2019-01-17 DIAGNOSIS — K5909 Other constipation: Secondary | ICD-10-CM | POA: Diagnosis not present

## 2019-01-17 DIAGNOSIS — M6281 Muscle weakness (generalized): Secondary | ICD-10-CM | POA: Diagnosis not present

## 2019-01-17 DIAGNOSIS — R05 Cough: Secondary | ICD-10-CM | POA: Diagnosis not present

## 2019-01-22 DIAGNOSIS — F064 Anxiety disorder due to known physiological condition: Secondary | ICD-10-CM | POA: Diagnosis not present

## 2019-01-22 DIAGNOSIS — G301 Alzheimer's disease with late onset: Secondary | ICD-10-CM | POA: Diagnosis not present

## 2019-01-22 DIAGNOSIS — F5105 Insomnia due to other mental disorder: Secondary | ICD-10-CM | POA: Diagnosis not present

## 2019-01-22 DIAGNOSIS — F0280 Dementia in other diseases classified elsewhere without behavioral disturbance: Secondary | ICD-10-CM | POA: Diagnosis not present

## 2019-02-05 DIAGNOSIS — F064 Anxiety disorder due to known physiological condition: Secondary | ICD-10-CM | POA: Diagnosis not present

## 2019-02-05 DIAGNOSIS — G301 Alzheimer's disease with late onset: Secondary | ICD-10-CM | POA: Diagnosis not present

## 2019-02-05 DIAGNOSIS — F5105 Insomnia due to other mental disorder: Secondary | ICD-10-CM | POA: Diagnosis not present

## 2019-02-05 DIAGNOSIS — F0280 Dementia in other diseases classified elsewhere without behavioral disturbance: Secondary | ICD-10-CM | POA: Diagnosis not present

## 2019-02-12 DIAGNOSIS — G301 Alzheimer's disease with late onset: Secondary | ICD-10-CM | POA: Diagnosis not present

## 2019-02-12 DIAGNOSIS — F5105 Insomnia due to other mental disorder: Secondary | ICD-10-CM | POA: Diagnosis not present

## 2019-02-12 DIAGNOSIS — F064 Anxiety disorder due to known physiological condition: Secondary | ICD-10-CM | POA: Diagnosis not present

## 2019-02-12 DIAGNOSIS — F0280 Dementia in other diseases classified elsewhere without behavioral disturbance: Secondary | ICD-10-CM | POA: Diagnosis not present

## 2019-02-23 DIAGNOSIS — M6281 Muscle weakness (generalized): Secondary | ICD-10-CM | POA: Diagnosis not present

## 2019-02-23 DIAGNOSIS — R05 Cough: Secondary | ICD-10-CM | POA: Diagnosis not present

## 2019-02-23 DIAGNOSIS — K5909 Other constipation: Secondary | ICD-10-CM | POA: Diagnosis not present

## 2019-02-23 DIAGNOSIS — R600 Localized edema: Secondary | ICD-10-CM | POA: Diagnosis not present

## 2019-02-27 DIAGNOSIS — F0280 Dementia in other diseases classified elsewhere without behavioral disturbance: Secondary | ICD-10-CM | POA: Diagnosis not present

## 2019-02-27 DIAGNOSIS — C50919 Malignant neoplasm of unspecified site of unspecified female breast: Secondary | ICD-10-CM | POA: Diagnosis not present

## 2019-02-28 DIAGNOSIS — D510 Vitamin B12 deficiency anemia due to intrinsic factor deficiency: Secondary | ICD-10-CM | POA: Diagnosis not present

## 2019-02-28 DIAGNOSIS — E785 Hyperlipidemia, unspecified: Secondary | ICD-10-CM | POA: Diagnosis not present

## 2019-02-28 DIAGNOSIS — E039 Hypothyroidism, unspecified: Secondary | ICD-10-CM | POA: Diagnosis not present

## 2019-02-28 DIAGNOSIS — I10 Essential (primary) hypertension: Secondary | ICD-10-CM | POA: Diagnosis not present

## 2019-03-09 DIAGNOSIS — R600 Localized edema: Secondary | ICD-10-CM | POA: Diagnosis not present

## 2019-03-09 DIAGNOSIS — K5909 Other constipation: Secondary | ICD-10-CM | POA: Diagnosis not present

## 2019-03-09 DIAGNOSIS — M6281 Muscle weakness (generalized): Secondary | ICD-10-CM | POA: Diagnosis not present

## 2019-03-09 DIAGNOSIS — R05 Cough: Secondary | ICD-10-CM | POA: Diagnosis not present

## 2019-03-19 DIAGNOSIS — F064 Anxiety disorder due to known physiological condition: Secondary | ICD-10-CM | POA: Diagnosis not present

## 2019-03-19 DIAGNOSIS — G301 Alzheimer's disease with late onset: Secondary | ICD-10-CM | POA: Diagnosis not present

## 2019-03-19 DIAGNOSIS — F5105 Insomnia due to other mental disorder: Secondary | ICD-10-CM | POA: Diagnosis not present

## 2019-03-19 DIAGNOSIS — F0280 Dementia in other diseases classified elsewhere without behavioral disturbance: Secondary | ICD-10-CM | POA: Diagnosis not present

## 2019-03-29 DIAGNOSIS — H409 Unspecified glaucoma: Secondary | ICD-10-CM | POA: Diagnosis not present

## 2019-03-29 DIAGNOSIS — F0280 Dementia in other diseases classified elsewhere without behavioral disturbance: Secondary | ICD-10-CM | POA: Diagnosis not present

## 2019-04-03 DIAGNOSIS — F064 Anxiety disorder due to known physiological condition: Secondary | ICD-10-CM | POA: Diagnosis not present

## 2019-04-03 DIAGNOSIS — G301 Alzheimer's disease with late onset: Secondary | ICD-10-CM | POA: Diagnosis not present

## 2019-04-03 DIAGNOSIS — F0280 Dementia in other diseases classified elsewhere without behavioral disturbance: Secondary | ICD-10-CM | POA: Diagnosis not present

## 2019-04-03 DIAGNOSIS — F5105 Insomnia due to other mental disorder: Secondary | ICD-10-CM | POA: Diagnosis not present

## 2019-04-10 DIAGNOSIS — M6281 Muscle weakness (generalized): Secondary | ICD-10-CM | POA: Diagnosis not present

## 2019-04-10 DIAGNOSIS — K5909 Other constipation: Secondary | ICD-10-CM | POA: Diagnosis not present

## 2019-04-10 DIAGNOSIS — R05 Cough: Secondary | ICD-10-CM | POA: Diagnosis not present

## 2019-04-10 DIAGNOSIS — R600 Localized edema: Secondary | ICD-10-CM | POA: Diagnosis not present

## 2019-04-20 DIAGNOSIS — R600 Localized edema: Secondary | ICD-10-CM | POA: Diagnosis not present

## 2019-04-20 DIAGNOSIS — M6281 Muscle weakness (generalized): Secondary | ICD-10-CM | POA: Diagnosis not present

## 2019-04-20 DIAGNOSIS — K5909 Other constipation: Secondary | ICD-10-CM | POA: Diagnosis not present

## 2019-04-20 DIAGNOSIS — R05 Cough: Secondary | ICD-10-CM | POA: Diagnosis not present

## 2019-05-22 DIAGNOSIS — G309 Alzheimer's disease, unspecified: Secondary | ICD-10-CM | POA: Diagnosis not present

## 2019-05-22 DIAGNOSIS — I10 Essential (primary) hypertension: Secondary | ICD-10-CM | POA: Diagnosis not present

## 2019-05-22 DIAGNOSIS — F0280 Dementia in other diseases classified elsewhere without behavioral disturbance: Secondary | ICD-10-CM | POA: Diagnosis not present

## 2019-05-22 DIAGNOSIS — E119 Type 2 diabetes mellitus without complications: Secondary | ICD-10-CM | POA: Diagnosis not present

## 2019-05-22 DIAGNOSIS — C189 Malignant neoplasm of colon, unspecified: Secondary | ICD-10-CM | POA: Diagnosis not present

## 2019-05-22 DIAGNOSIS — R1312 Dysphagia, oropharyngeal phase: Secondary | ICD-10-CM | POA: Diagnosis not present

## 2019-05-22 DIAGNOSIS — F329 Major depressive disorder, single episode, unspecified: Secondary | ICD-10-CM | POA: Diagnosis not present

## 2019-05-22 DIAGNOSIS — G894 Chronic pain syndrome: Secondary | ICD-10-CM | POA: Diagnosis not present

## 2019-05-22 DIAGNOSIS — F419 Anxiety disorder, unspecified: Secondary | ICD-10-CM | POA: Diagnosis not present

## 2019-05-25 DIAGNOSIS — C189 Malignant neoplasm of colon, unspecified: Secondary | ICD-10-CM | POA: Diagnosis not present

## 2019-05-25 DIAGNOSIS — E119 Type 2 diabetes mellitus without complications: Secondary | ICD-10-CM | POA: Diagnosis not present

## 2019-05-25 DIAGNOSIS — F0280 Dementia in other diseases classified elsewhere without behavioral disturbance: Secondary | ICD-10-CM | POA: Diagnosis not present

## 2019-05-25 DIAGNOSIS — F329 Major depressive disorder, single episode, unspecified: Secondary | ICD-10-CM | POA: Diagnosis not present

## 2019-05-25 DIAGNOSIS — G894 Chronic pain syndrome: Secondary | ICD-10-CM | POA: Diagnosis not present

## 2019-05-25 DIAGNOSIS — F419 Anxiety disorder, unspecified: Secondary | ICD-10-CM | POA: Diagnosis not present

## 2019-05-25 DIAGNOSIS — R1312 Dysphagia, oropharyngeal phase: Secondary | ICD-10-CM | POA: Diagnosis not present

## 2019-05-25 DIAGNOSIS — I10 Essential (primary) hypertension: Secondary | ICD-10-CM | POA: Diagnosis not present

## 2019-05-25 DIAGNOSIS — G309 Alzheimer's disease, unspecified: Secondary | ICD-10-CM | POA: Diagnosis not present

## 2019-05-28 DIAGNOSIS — F5105 Insomnia due to other mental disorder: Secondary | ICD-10-CM | POA: Diagnosis not present

## 2019-05-28 DIAGNOSIS — F0280 Dementia in other diseases classified elsewhere without behavioral disturbance: Secondary | ICD-10-CM | POA: Diagnosis not present

## 2019-05-28 DIAGNOSIS — G301 Alzheimer's disease with late onset: Secondary | ICD-10-CM | POA: Diagnosis not present

## 2019-05-28 DIAGNOSIS — F064 Anxiety disorder due to known physiological condition: Secondary | ICD-10-CM | POA: Diagnosis not present

## 2019-05-29 DIAGNOSIS — F0280 Dementia in other diseases classified elsewhere without behavioral disturbance: Secondary | ICD-10-CM | POA: Diagnosis not present

## 2019-05-29 DIAGNOSIS — F329 Major depressive disorder, single episode, unspecified: Secondary | ICD-10-CM | POA: Diagnosis not present

## 2019-05-29 DIAGNOSIS — F419 Anxiety disorder, unspecified: Secondary | ICD-10-CM | POA: Diagnosis not present

## 2019-05-29 DIAGNOSIS — R1312 Dysphagia, oropharyngeal phase: Secondary | ICD-10-CM | POA: Diagnosis not present

## 2019-05-29 DIAGNOSIS — E782 Mixed hyperlipidemia: Secondary | ICD-10-CM | POA: Diagnosis not present

## 2019-05-29 DIAGNOSIS — I739 Peripheral vascular disease, unspecified: Secondary | ICD-10-CM | POA: Diagnosis not present

## 2019-05-29 DIAGNOSIS — C189 Malignant neoplasm of colon, unspecified: Secondary | ICD-10-CM | POA: Diagnosis not present

## 2019-05-29 DIAGNOSIS — G894 Chronic pain syndrome: Secondary | ICD-10-CM | POA: Diagnosis not present

## 2019-05-29 DIAGNOSIS — I10 Essential (primary) hypertension: Secondary | ICD-10-CM | POA: Diagnosis not present

## 2019-05-29 DIAGNOSIS — E119 Type 2 diabetes mellitus without complications: Secondary | ICD-10-CM | POA: Diagnosis not present

## 2019-05-29 DIAGNOSIS — G309 Alzheimer's disease, unspecified: Secondary | ICD-10-CM | POA: Diagnosis not present

## 2019-05-31 DIAGNOSIS — R601 Generalized edema: Secondary | ICD-10-CM | POA: Diagnosis not present

## 2019-05-31 DIAGNOSIS — M79601 Pain in right arm: Secondary | ICD-10-CM | POA: Diagnosis not present

## 2019-06-01 DIAGNOSIS — I10 Essential (primary) hypertension: Secondary | ICD-10-CM | POA: Diagnosis not present

## 2019-06-01 DIAGNOSIS — F419 Anxiety disorder, unspecified: Secondary | ICD-10-CM | POA: Diagnosis not present

## 2019-06-01 DIAGNOSIS — F329 Major depressive disorder, single episode, unspecified: Secondary | ICD-10-CM | POA: Diagnosis not present

## 2019-06-01 DIAGNOSIS — M25511 Pain in right shoulder: Secondary | ICD-10-CM | POA: Diagnosis not present

## 2019-06-01 DIAGNOSIS — M79631 Pain in right forearm: Secondary | ICD-10-CM | POA: Diagnosis not present

## 2019-06-01 DIAGNOSIS — G309 Alzheimer's disease, unspecified: Secondary | ICD-10-CM | POA: Diagnosis not present

## 2019-06-01 DIAGNOSIS — C189 Malignant neoplasm of colon, unspecified: Secondary | ICD-10-CM | POA: Diagnosis not present

## 2019-06-01 DIAGNOSIS — G894 Chronic pain syndrome: Secondary | ICD-10-CM | POA: Diagnosis not present

## 2019-06-01 DIAGNOSIS — R1312 Dysphagia, oropharyngeal phase: Secondary | ICD-10-CM | POA: Diagnosis not present

## 2019-06-01 DIAGNOSIS — M79621 Pain in right upper arm: Secondary | ICD-10-CM | POA: Diagnosis not present

## 2019-06-01 DIAGNOSIS — E119 Type 2 diabetes mellitus without complications: Secondary | ICD-10-CM | POA: Diagnosis not present

## 2019-06-01 DIAGNOSIS — F0280 Dementia in other diseases classified elsewhere without behavioral disturbance: Secondary | ICD-10-CM | POA: Diagnosis not present

## 2019-06-07 DIAGNOSIS — M79601 Pain in right arm: Secondary | ICD-10-CM | POA: Diagnosis not present

## 2019-06-07 DIAGNOSIS — R601 Generalized edema: Secondary | ICD-10-CM | POA: Diagnosis not present

## 2019-06-12 DIAGNOSIS — F329 Major depressive disorder, single episode, unspecified: Secondary | ICD-10-CM | POA: Diagnosis not present

## 2019-06-12 DIAGNOSIS — G894 Chronic pain syndrome: Secondary | ICD-10-CM | POA: Diagnosis not present

## 2019-06-12 DIAGNOSIS — E119 Type 2 diabetes mellitus without complications: Secondary | ICD-10-CM | POA: Diagnosis not present

## 2019-06-12 DIAGNOSIS — C189 Malignant neoplasm of colon, unspecified: Secondary | ICD-10-CM | POA: Diagnosis not present

## 2019-06-12 DIAGNOSIS — G309 Alzheimer's disease, unspecified: Secondary | ICD-10-CM | POA: Diagnosis not present

## 2019-06-12 DIAGNOSIS — F419 Anxiety disorder, unspecified: Secondary | ICD-10-CM | POA: Diagnosis not present

## 2019-06-12 DIAGNOSIS — I10 Essential (primary) hypertension: Secondary | ICD-10-CM | POA: Diagnosis not present

## 2019-06-12 DIAGNOSIS — R1312 Dysphagia, oropharyngeal phase: Secondary | ICD-10-CM | POA: Diagnosis not present

## 2019-06-12 DIAGNOSIS — F0280 Dementia in other diseases classified elsewhere without behavioral disturbance: Secondary | ICD-10-CM | POA: Diagnosis not present

## 2019-06-15 DIAGNOSIS — E119 Type 2 diabetes mellitus without complications: Secondary | ICD-10-CM | POA: Diagnosis not present

## 2019-06-15 DIAGNOSIS — G894 Chronic pain syndrome: Secondary | ICD-10-CM | POA: Diagnosis not present

## 2019-06-15 DIAGNOSIS — R1312 Dysphagia, oropharyngeal phase: Secondary | ICD-10-CM | POA: Diagnosis not present

## 2019-06-15 DIAGNOSIS — R609 Edema, unspecified: Secondary | ICD-10-CM | POA: Diagnosis not present

## 2019-06-15 DIAGNOSIS — I10 Essential (primary) hypertension: Secondary | ICD-10-CM | POA: Diagnosis not present

## 2019-06-15 DIAGNOSIS — C189 Malignant neoplasm of colon, unspecified: Secondary | ICD-10-CM | POA: Diagnosis not present

## 2019-06-15 DIAGNOSIS — F329 Major depressive disorder, single episode, unspecified: Secondary | ICD-10-CM | POA: Diagnosis not present

## 2019-06-15 DIAGNOSIS — F0280 Dementia in other diseases classified elsewhere without behavioral disturbance: Secondary | ICD-10-CM | POA: Diagnosis not present

## 2019-06-15 DIAGNOSIS — G309 Alzheimer's disease, unspecified: Secondary | ICD-10-CM | POA: Diagnosis not present

## 2019-06-15 DIAGNOSIS — K5900 Constipation, unspecified: Secondary | ICD-10-CM | POA: Diagnosis not present

## 2019-06-15 DIAGNOSIS — E559 Vitamin D deficiency, unspecified: Secondary | ICD-10-CM | POA: Diagnosis not present

## 2019-06-15 DIAGNOSIS — F5000 Anorexia nervosa, unspecified: Secondary | ICD-10-CM | POA: Diagnosis not present

## 2019-06-15 DIAGNOSIS — F419 Anxiety disorder, unspecified: Secondary | ICD-10-CM | POA: Diagnosis not present

## 2019-06-19 DIAGNOSIS — F419 Anxiety disorder, unspecified: Secondary | ICD-10-CM | POA: Diagnosis not present

## 2019-06-19 DIAGNOSIS — C189 Malignant neoplasm of colon, unspecified: Secondary | ICD-10-CM | POA: Diagnosis not present

## 2019-06-19 DIAGNOSIS — F0280 Dementia in other diseases classified elsewhere without behavioral disturbance: Secondary | ICD-10-CM | POA: Diagnosis not present

## 2019-06-19 DIAGNOSIS — G894 Chronic pain syndrome: Secondary | ICD-10-CM | POA: Diagnosis not present

## 2019-06-19 DIAGNOSIS — G309 Alzheimer's disease, unspecified: Secondary | ICD-10-CM | POA: Diagnosis not present

## 2019-06-19 DIAGNOSIS — F329 Major depressive disorder, single episode, unspecified: Secondary | ICD-10-CM | POA: Diagnosis not present

## 2019-06-19 DIAGNOSIS — I10 Essential (primary) hypertension: Secondary | ICD-10-CM | POA: Diagnosis not present

## 2019-06-19 DIAGNOSIS — R1312 Dysphagia, oropharyngeal phase: Secondary | ICD-10-CM | POA: Diagnosis not present

## 2019-06-19 DIAGNOSIS — E119 Type 2 diabetes mellitus without complications: Secondary | ICD-10-CM | POA: Diagnosis not present

## 2019-06-20 DIAGNOSIS — Q845 Enlarged and hypertrophic nails: Secondary | ICD-10-CM | POA: Diagnosis not present

## 2019-06-20 DIAGNOSIS — B351 Tinea unguium: Secondary | ICD-10-CM | POA: Diagnosis not present

## 2019-06-20 DIAGNOSIS — L603 Nail dystrophy: Secondary | ICD-10-CM | POA: Diagnosis not present

## 2019-06-20 DIAGNOSIS — I739 Peripheral vascular disease, unspecified: Secondary | ICD-10-CM | POA: Diagnosis not present

## 2019-06-22 DIAGNOSIS — G894 Chronic pain syndrome: Secondary | ICD-10-CM | POA: Diagnosis not present

## 2019-06-22 DIAGNOSIS — F0280 Dementia in other diseases classified elsewhere without behavioral disturbance: Secondary | ICD-10-CM | POA: Diagnosis not present

## 2019-06-22 DIAGNOSIS — E119 Type 2 diabetes mellitus without complications: Secondary | ICD-10-CM | POA: Diagnosis not present

## 2019-06-22 DIAGNOSIS — R1312 Dysphagia, oropharyngeal phase: Secondary | ICD-10-CM | POA: Diagnosis not present

## 2019-06-22 DIAGNOSIS — F329 Major depressive disorder, single episode, unspecified: Secondary | ICD-10-CM | POA: Diagnosis not present

## 2019-06-22 DIAGNOSIS — G309 Alzheimer's disease, unspecified: Secondary | ICD-10-CM | POA: Diagnosis not present

## 2019-06-22 DIAGNOSIS — F419 Anxiety disorder, unspecified: Secondary | ICD-10-CM | POA: Diagnosis not present

## 2019-06-22 DIAGNOSIS — I10 Essential (primary) hypertension: Secondary | ICD-10-CM | POA: Diagnosis not present

## 2019-06-22 DIAGNOSIS — C189 Malignant neoplasm of colon, unspecified: Secondary | ICD-10-CM | POA: Diagnosis not present

## 2019-06-26 DIAGNOSIS — I10 Essential (primary) hypertension: Secondary | ICD-10-CM | POA: Diagnosis not present

## 2019-06-26 DIAGNOSIS — F064 Anxiety disorder due to known physiological condition: Secondary | ICD-10-CM | POA: Diagnosis not present

## 2019-06-26 DIAGNOSIS — G301 Alzheimer's disease with late onset: Secondary | ICD-10-CM | POA: Diagnosis not present

## 2019-06-26 DIAGNOSIS — G309 Alzheimer's disease, unspecified: Secondary | ICD-10-CM | POA: Diagnosis not present

## 2019-06-26 DIAGNOSIS — G894 Chronic pain syndrome: Secondary | ICD-10-CM | POA: Diagnosis not present

## 2019-06-26 DIAGNOSIS — C189 Malignant neoplasm of colon, unspecified: Secondary | ICD-10-CM | POA: Diagnosis not present

## 2019-06-26 DIAGNOSIS — F5105 Insomnia due to other mental disorder: Secondary | ICD-10-CM | POA: Diagnosis not present

## 2019-06-26 DIAGNOSIS — F0280 Dementia in other diseases classified elsewhere without behavioral disturbance: Secondary | ICD-10-CM | POA: Diagnosis not present

## 2019-06-26 DIAGNOSIS — E119 Type 2 diabetes mellitus without complications: Secondary | ICD-10-CM | POA: Diagnosis not present

## 2019-06-26 DIAGNOSIS — R1312 Dysphagia, oropharyngeal phase: Secondary | ICD-10-CM | POA: Diagnosis not present

## 2019-06-26 DIAGNOSIS — F329 Major depressive disorder, single episode, unspecified: Secondary | ICD-10-CM | POA: Diagnosis not present

## 2019-06-26 DIAGNOSIS — F0281 Dementia in other diseases classified elsewhere with behavioral disturbance: Secondary | ICD-10-CM | POA: Diagnosis not present

## 2019-06-26 DIAGNOSIS — F419 Anxiety disorder, unspecified: Secondary | ICD-10-CM | POA: Diagnosis not present

## 2019-06-29 DIAGNOSIS — F329 Major depressive disorder, single episode, unspecified: Secondary | ICD-10-CM | POA: Diagnosis not present

## 2019-06-29 DIAGNOSIS — G309 Alzheimer's disease, unspecified: Secondary | ICD-10-CM | POA: Diagnosis not present

## 2019-06-29 DIAGNOSIS — I10 Essential (primary) hypertension: Secondary | ICD-10-CM | POA: Diagnosis not present

## 2019-06-29 DIAGNOSIS — E119 Type 2 diabetes mellitus without complications: Secondary | ICD-10-CM | POA: Diagnosis not present

## 2019-06-29 DIAGNOSIS — G894 Chronic pain syndrome: Secondary | ICD-10-CM | POA: Diagnosis not present

## 2019-06-29 DIAGNOSIS — F0280 Dementia in other diseases classified elsewhere without behavioral disturbance: Secondary | ICD-10-CM | POA: Diagnosis not present

## 2019-06-29 DIAGNOSIS — R1312 Dysphagia, oropharyngeal phase: Secondary | ICD-10-CM | POA: Diagnosis not present

## 2019-06-29 DIAGNOSIS — F419 Anxiety disorder, unspecified: Secondary | ICD-10-CM | POA: Diagnosis not present

## 2019-06-29 DIAGNOSIS — C189 Malignant neoplasm of colon, unspecified: Secondary | ICD-10-CM | POA: Diagnosis not present

## 2019-07-13 DIAGNOSIS — E559 Vitamin D deficiency, unspecified: Secondary | ICD-10-CM | POA: Diagnosis not present

## 2019-07-13 DIAGNOSIS — K5900 Constipation, unspecified: Secondary | ICD-10-CM | POA: Diagnosis not present

## 2019-07-13 DIAGNOSIS — R600 Localized edema: Secondary | ICD-10-CM | POA: Diagnosis not present

## 2019-07-24 DIAGNOSIS — F5105 Insomnia due to other mental disorder: Secondary | ICD-10-CM | POA: Diagnosis not present

## 2019-07-24 DIAGNOSIS — G301 Alzheimer's disease with late onset: Secondary | ICD-10-CM | POA: Diagnosis not present

## 2019-07-24 DIAGNOSIS — F0281 Dementia in other diseases classified elsewhere with behavioral disturbance: Secondary | ICD-10-CM | POA: Diagnosis not present

## 2019-08-10 DIAGNOSIS — R630 Anorexia: Secondary | ICD-10-CM | POA: Diagnosis not present

## 2019-08-10 DIAGNOSIS — R2689 Other abnormalities of gait and mobility: Secondary | ICD-10-CM | POA: Diagnosis not present

## 2019-08-10 DIAGNOSIS — E876 Hypokalemia: Secondary | ICD-10-CM | POA: Diagnosis not present

## 2019-08-21 DIAGNOSIS — G301 Alzheimer's disease with late onset: Secondary | ICD-10-CM | POA: Diagnosis not present

## 2019-08-21 DIAGNOSIS — F5105 Insomnia due to other mental disorder: Secondary | ICD-10-CM | POA: Diagnosis not present

## 2019-08-21 DIAGNOSIS — G894 Chronic pain syndrome: Secondary | ICD-10-CM | POA: Diagnosis not present

## 2019-08-21 DIAGNOSIS — F064 Anxiety disorder due to known physiological condition: Secondary | ICD-10-CM | POA: Diagnosis not present

## 2019-08-21 DIAGNOSIS — F0281 Dementia in other diseases classified elsewhere with behavioral disturbance: Secondary | ICD-10-CM | POA: Diagnosis not present

## 2019-08-22 DIAGNOSIS — E119 Type 2 diabetes mellitus without complications: Secondary | ICD-10-CM | POA: Diagnosis not present

## 2019-08-22 DIAGNOSIS — R21 Rash and other nonspecific skin eruption: Secondary | ICD-10-CM | POA: Diagnosis not present

## 2019-08-22 DIAGNOSIS — R1310 Dysphagia, unspecified: Secondary | ICD-10-CM | POA: Diagnosis not present

## 2019-08-22 DIAGNOSIS — F0280 Dementia in other diseases classified elsewhere without behavioral disturbance: Secondary | ICD-10-CM | POA: Diagnosis not present

## 2019-08-22 DIAGNOSIS — G309 Alzheimer's disease, unspecified: Secondary | ICD-10-CM | POA: Diagnosis not present

## 2019-08-22 DIAGNOSIS — Z7982 Long term (current) use of aspirin: Secondary | ICD-10-CM | POA: Diagnosis not present

## 2019-08-22 DIAGNOSIS — M6281 Muscle weakness (generalized): Secondary | ICD-10-CM | POA: Diagnosis not present

## 2019-08-22 DIAGNOSIS — R32 Unspecified urinary incontinence: Secondary | ICD-10-CM | POA: Diagnosis not present

## 2019-08-22 DIAGNOSIS — Z85030 Personal history of malignant carcinoid tumor of large intestine: Secondary | ICD-10-CM | POA: Diagnosis not present

## 2019-08-28 DIAGNOSIS — E119 Type 2 diabetes mellitus without complications: Secondary | ICD-10-CM | POA: Diagnosis not present

## 2019-08-28 DIAGNOSIS — R32 Unspecified urinary incontinence: Secondary | ICD-10-CM | POA: Diagnosis not present

## 2019-08-28 DIAGNOSIS — Z85030 Personal history of malignant carcinoid tumor of large intestine: Secondary | ICD-10-CM | POA: Diagnosis not present

## 2019-08-28 DIAGNOSIS — G309 Alzheimer's disease, unspecified: Secondary | ICD-10-CM | POA: Diagnosis not present

## 2019-08-28 DIAGNOSIS — R21 Rash and other nonspecific skin eruption: Secondary | ICD-10-CM | POA: Diagnosis not present

## 2019-08-28 DIAGNOSIS — Z7982 Long term (current) use of aspirin: Secondary | ICD-10-CM | POA: Diagnosis not present

## 2019-08-28 DIAGNOSIS — M6281 Muscle weakness (generalized): Secondary | ICD-10-CM | POA: Diagnosis not present

## 2019-08-28 DIAGNOSIS — R1310 Dysphagia, unspecified: Secondary | ICD-10-CM | POA: Diagnosis not present

## 2019-08-28 DIAGNOSIS — F0280 Dementia in other diseases classified elsewhere without behavioral disturbance: Secondary | ICD-10-CM | POA: Diagnosis not present

## 2019-08-30 DIAGNOSIS — M6281 Muscle weakness (generalized): Secondary | ICD-10-CM | POA: Diagnosis not present

## 2019-08-30 DIAGNOSIS — G309 Alzheimer's disease, unspecified: Secondary | ICD-10-CM | POA: Diagnosis not present

## 2019-08-30 DIAGNOSIS — R32 Unspecified urinary incontinence: Secondary | ICD-10-CM | POA: Diagnosis not present

## 2019-08-30 DIAGNOSIS — Z85030 Personal history of malignant carcinoid tumor of large intestine: Secondary | ICD-10-CM | POA: Diagnosis not present

## 2019-08-30 DIAGNOSIS — R21 Rash and other nonspecific skin eruption: Secondary | ICD-10-CM | POA: Diagnosis not present

## 2019-08-30 DIAGNOSIS — E119 Type 2 diabetes mellitus without complications: Secondary | ICD-10-CM | POA: Diagnosis not present

## 2019-08-30 DIAGNOSIS — R1310 Dysphagia, unspecified: Secondary | ICD-10-CM | POA: Diagnosis not present

## 2019-08-30 DIAGNOSIS — F0280 Dementia in other diseases classified elsewhere without behavioral disturbance: Secondary | ICD-10-CM | POA: Diagnosis not present

## 2019-08-30 DIAGNOSIS — Z7982 Long term (current) use of aspirin: Secondary | ICD-10-CM | POA: Diagnosis not present

## 2019-09-03 DIAGNOSIS — M6281 Muscle weakness (generalized): Secondary | ICD-10-CM | POA: Diagnosis not present

## 2019-09-03 DIAGNOSIS — R21 Rash and other nonspecific skin eruption: Secondary | ICD-10-CM | POA: Diagnosis not present

## 2019-09-03 DIAGNOSIS — G309 Alzheimer's disease, unspecified: Secondary | ICD-10-CM | POA: Diagnosis not present

## 2019-09-03 DIAGNOSIS — R32 Unspecified urinary incontinence: Secondary | ICD-10-CM | POA: Diagnosis not present

## 2019-09-03 DIAGNOSIS — Z85030 Personal history of malignant carcinoid tumor of large intestine: Secondary | ICD-10-CM | POA: Diagnosis not present

## 2019-09-03 DIAGNOSIS — F0280 Dementia in other diseases classified elsewhere without behavioral disturbance: Secondary | ICD-10-CM | POA: Diagnosis not present

## 2019-09-03 DIAGNOSIS — E119 Type 2 diabetes mellitus without complications: Secondary | ICD-10-CM | POA: Diagnosis not present

## 2019-09-03 DIAGNOSIS — Z7982 Long term (current) use of aspirin: Secondary | ICD-10-CM | POA: Diagnosis not present

## 2019-09-03 DIAGNOSIS — R1310 Dysphagia, unspecified: Secondary | ICD-10-CM | POA: Diagnosis not present

## 2019-09-04 DIAGNOSIS — R600 Localized edema: Secondary | ICD-10-CM | POA: Diagnosis not present

## 2019-09-04 DIAGNOSIS — E559 Vitamin D deficiency, unspecified: Secondary | ICD-10-CM | POA: Diagnosis not present

## 2019-09-04 DIAGNOSIS — R2689 Other abnormalities of gait and mobility: Secondary | ICD-10-CM | POA: Diagnosis not present

## 2019-09-04 DIAGNOSIS — E876 Hypokalemia: Secondary | ICD-10-CM | POA: Diagnosis not present

## 2019-09-07 DIAGNOSIS — R1310 Dysphagia, unspecified: Secondary | ICD-10-CM | POA: Diagnosis not present

## 2019-09-07 DIAGNOSIS — R21 Rash and other nonspecific skin eruption: Secondary | ICD-10-CM | POA: Diagnosis not present

## 2019-09-07 DIAGNOSIS — M6281 Muscle weakness (generalized): Secondary | ICD-10-CM | POA: Diagnosis not present

## 2019-09-07 DIAGNOSIS — R32 Unspecified urinary incontinence: Secondary | ICD-10-CM | POA: Diagnosis not present

## 2019-09-07 DIAGNOSIS — Z85030 Personal history of malignant carcinoid tumor of large intestine: Secondary | ICD-10-CM | POA: Diagnosis not present

## 2019-09-07 DIAGNOSIS — E119 Type 2 diabetes mellitus without complications: Secondary | ICD-10-CM | POA: Diagnosis not present

## 2019-09-07 DIAGNOSIS — G309 Alzheimer's disease, unspecified: Secondary | ICD-10-CM | POA: Diagnosis not present

## 2019-09-07 DIAGNOSIS — K5900 Constipation, unspecified: Secondary | ICD-10-CM | POA: Diagnosis not present

## 2019-09-07 DIAGNOSIS — R630 Anorexia: Secondary | ICD-10-CM | POA: Diagnosis not present

## 2019-09-07 DIAGNOSIS — Z7982 Long term (current) use of aspirin: Secondary | ICD-10-CM | POA: Diagnosis not present

## 2019-09-07 DIAGNOSIS — F0280 Dementia in other diseases classified elsewhere without behavioral disturbance: Secondary | ICD-10-CM | POA: Diagnosis not present

## 2019-09-07 DIAGNOSIS — E559 Vitamin D deficiency, unspecified: Secondary | ICD-10-CM | POA: Diagnosis not present

## 2019-09-08 DIAGNOSIS — G309 Alzheimer's disease, unspecified: Secondary | ICD-10-CM | POA: Diagnosis not present

## 2019-09-08 DIAGNOSIS — E119 Type 2 diabetes mellitus without complications: Secondary | ICD-10-CM | POA: Diagnosis not present

## 2019-09-08 DIAGNOSIS — M6281 Muscle weakness (generalized): Secondary | ICD-10-CM | POA: Diagnosis not present

## 2019-09-08 DIAGNOSIS — F0280 Dementia in other diseases classified elsewhere without behavioral disturbance: Secondary | ICD-10-CM | POA: Diagnosis not present

## 2019-09-08 DIAGNOSIS — Z7982 Long term (current) use of aspirin: Secondary | ICD-10-CM | POA: Diagnosis not present

## 2019-09-08 DIAGNOSIS — Z85030 Personal history of malignant carcinoid tumor of large intestine: Secondary | ICD-10-CM | POA: Diagnosis not present

## 2019-09-08 DIAGNOSIS — R21 Rash and other nonspecific skin eruption: Secondary | ICD-10-CM | POA: Diagnosis not present

## 2019-09-08 DIAGNOSIS — R1310 Dysphagia, unspecified: Secondary | ICD-10-CM | POA: Diagnosis not present

## 2019-09-08 DIAGNOSIS — R32 Unspecified urinary incontinence: Secondary | ICD-10-CM | POA: Diagnosis not present

## 2019-09-10 DIAGNOSIS — F0280 Dementia in other diseases classified elsewhere without behavioral disturbance: Secondary | ICD-10-CM | POA: Diagnosis not present

## 2019-09-10 DIAGNOSIS — E119 Type 2 diabetes mellitus without complications: Secondary | ICD-10-CM | POA: Diagnosis not present

## 2019-09-10 DIAGNOSIS — Z7982 Long term (current) use of aspirin: Secondary | ICD-10-CM | POA: Diagnosis not present

## 2019-09-10 DIAGNOSIS — G309 Alzheimer's disease, unspecified: Secondary | ICD-10-CM | POA: Diagnosis not present

## 2019-09-10 DIAGNOSIS — R32 Unspecified urinary incontinence: Secondary | ICD-10-CM | POA: Diagnosis not present

## 2019-09-10 DIAGNOSIS — R21 Rash and other nonspecific skin eruption: Secondary | ICD-10-CM | POA: Diagnosis not present

## 2019-09-10 DIAGNOSIS — R1310 Dysphagia, unspecified: Secondary | ICD-10-CM | POA: Diagnosis not present

## 2019-09-10 DIAGNOSIS — M6281 Muscle weakness (generalized): Secondary | ICD-10-CM | POA: Diagnosis not present

## 2019-09-10 DIAGNOSIS — Z85030 Personal history of malignant carcinoid tumor of large intestine: Secondary | ICD-10-CM | POA: Diagnosis not present

## 2019-09-11 DIAGNOSIS — L89312 Pressure ulcer of right buttock, stage 2: Secondary | ICD-10-CM | POA: Diagnosis not present

## 2019-09-11 DIAGNOSIS — R1310 Dysphagia, unspecified: Secondary | ICD-10-CM | POA: Diagnosis not present

## 2019-09-11 DIAGNOSIS — Z7982 Long term (current) use of aspirin: Secondary | ICD-10-CM | POA: Diagnosis not present

## 2019-09-11 DIAGNOSIS — F0280 Dementia in other diseases classified elsewhere without behavioral disturbance: Secondary | ICD-10-CM | POA: Diagnosis not present

## 2019-09-11 DIAGNOSIS — G309 Alzheimer's disease, unspecified: Secondary | ICD-10-CM | POA: Diagnosis not present

## 2019-09-11 DIAGNOSIS — R21 Rash and other nonspecific skin eruption: Secondary | ICD-10-CM | POA: Diagnosis not present

## 2019-09-11 DIAGNOSIS — Z85030 Personal history of malignant carcinoid tumor of large intestine: Secondary | ICD-10-CM | POA: Diagnosis not present

## 2019-09-11 DIAGNOSIS — R32 Unspecified urinary incontinence: Secondary | ICD-10-CM | POA: Diagnosis not present

## 2019-09-11 DIAGNOSIS — M6281 Muscle weakness (generalized): Secondary | ICD-10-CM | POA: Diagnosis not present

## 2019-09-11 DIAGNOSIS — L89322 Pressure ulcer of left buttock, stage 2: Secondary | ICD-10-CM | POA: Diagnosis not present

## 2019-09-11 DIAGNOSIS — E119 Type 2 diabetes mellitus without complications: Secondary | ICD-10-CM | POA: Diagnosis not present

## 2019-09-13 DIAGNOSIS — M6281 Muscle weakness (generalized): Secondary | ICD-10-CM | POA: Diagnosis not present

## 2019-09-13 DIAGNOSIS — E119 Type 2 diabetes mellitus without complications: Secondary | ICD-10-CM | POA: Diagnosis not present

## 2019-09-13 DIAGNOSIS — Z85030 Personal history of malignant carcinoid tumor of large intestine: Secondary | ICD-10-CM | POA: Diagnosis not present

## 2019-09-13 DIAGNOSIS — Z7982 Long term (current) use of aspirin: Secondary | ICD-10-CM | POA: Diagnosis not present

## 2019-09-13 DIAGNOSIS — R21 Rash and other nonspecific skin eruption: Secondary | ICD-10-CM | POA: Diagnosis not present

## 2019-09-13 DIAGNOSIS — F0280 Dementia in other diseases classified elsewhere without behavioral disturbance: Secondary | ICD-10-CM | POA: Diagnosis not present

## 2019-09-13 DIAGNOSIS — R1310 Dysphagia, unspecified: Secondary | ICD-10-CM | POA: Diagnosis not present

## 2019-09-13 DIAGNOSIS — R32 Unspecified urinary incontinence: Secondary | ICD-10-CM | POA: Diagnosis not present

## 2019-09-13 DIAGNOSIS — G309 Alzheimer's disease, unspecified: Secondary | ICD-10-CM | POA: Diagnosis not present

## 2019-09-17 DIAGNOSIS — R21 Rash and other nonspecific skin eruption: Secondary | ICD-10-CM | POA: Diagnosis not present

## 2019-09-17 DIAGNOSIS — Z85030 Personal history of malignant carcinoid tumor of large intestine: Secondary | ICD-10-CM | POA: Diagnosis not present

## 2019-09-17 DIAGNOSIS — R1310 Dysphagia, unspecified: Secondary | ICD-10-CM | POA: Diagnosis not present

## 2019-09-17 DIAGNOSIS — M6281 Muscle weakness (generalized): Secondary | ICD-10-CM | POA: Diagnosis not present

## 2019-09-17 DIAGNOSIS — E119 Type 2 diabetes mellitus without complications: Secondary | ICD-10-CM | POA: Diagnosis not present

## 2019-09-17 DIAGNOSIS — G301 Alzheimer's disease with late onset: Secondary | ICD-10-CM | POA: Diagnosis not present

## 2019-09-17 DIAGNOSIS — G894 Chronic pain syndrome: Secondary | ICD-10-CM | POA: Diagnosis not present

## 2019-09-17 DIAGNOSIS — R32 Unspecified urinary incontinence: Secondary | ICD-10-CM | POA: Diagnosis not present

## 2019-09-17 DIAGNOSIS — G309 Alzheimer's disease, unspecified: Secondary | ICD-10-CM | POA: Diagnosis not present

## 2019-09-17 DIAGNOSIS — Z7982 Long term (current) use of aspirin: Secondary | ICD-10-CM | POA: Diagnosis not present

## 2019-09-17 DIAGNOSIS — F0280 Dementia in other diseases classified elsewhere without behavioral disturbance: Secondary | ICD-10-CM | POA: Diagnosis not present

## 2019-09-18 DIAGNOSIS — R21 Rash and other nonspecific skin eruption: Secondary | ICD-10-CM | POA: Diagnosis not present

## 2019-09-18 DIAGNOSIS — F0281 Dementia in other diseases classified elsewhere with behavioral disturbance: Secondary | ICD-10-CM | POA: Diagnosis not present

## 2019-09-18 DIAGNOSIS — F064 Anxiety disorder due to known physiological condition: Secondary | ICD-10-CM | POA: Diagnosis not present

## 2019-09-18 DIAGNOSIS — R32 Unspecified urinary incontinence: Secondary | ICD-10-CM | POA: Diagnosis not present

## 2019-09-18 DIAGNOSIS — F5105 Insomnia due to other mental disorder: Secondary | ICD-10-CM | POA: Diagnosis not present

## 2019-09-18 DIAGNOSIS — M6281 Muscle weakness (generalized): Secondary | ICD-10-CM | POA: Diagnosis not present

## 2019-09-18 DIAGNOSIS — R1310 Dysphagia, unspecified: Secondary | ICD-10-CM | POA: Diagnosis not present

## 2019-09-18 DIAGNOSIS — G301 Alzheimer's disease with late onset: Secondary | ICD-10-CM | POA: Diagnosis not present

## 2019-09-18 DIAGNOSIS — F0280 Dementia in other diseases classified elsewhere without behavioral disturbance: Secondary | ICD-10-CM | POA: Diagnosis not present

## 2019-09-18 DIAGNOSIS — G309 Alzheimer's disease, unspecified: Secondary | ICD-10-CM | POA: Diagnosis not present

## 2019-09-18 DIAGNOSIS — Z7982 Long term (current) use of aspirin: Secondary | ICD-10-CM | POA: Diagnosis not present

## 2019-09-18 DIAGNOSIS — E119 Type 2 diabetes mellitus without complications: Secondary | ICD-10-CM | POA: Diagnosis not present

## 2019-09-18 DIAGNOSIS — Z85030 Personal history of malignant carcinoid tumor of large intestine: Secondary | ICD-10-CM | POA: Diagnosis not present

## 2019-09-19 DIAGNOSIS — M6281 Muscle weakness (generalized): Secondary | ICD-10-CM | POA: Diagnosis not present

## 2019-09-19 DIAGNOSIS — Z7982 Long term (current) use of aspirin: Secondary | ICD-10-CM | POA: Diagnosis not present

## 2019-09-19 DIAGNOSIS — F0280 Dementia in other diseases classified elsewhere without behavioral disturbance: Secondary | ICD-10-CM | POA: Diagnosis not present

## 2019-09-19 DIAGNOSIS — R32 Unspecified urinary incontinence: Secondary | ICD-10-CM | POA: Diagnosis not present

## 2019-09-19 DIAGNOSIS — G309 Alzheimer's disease, unspecified: Secondary | ICD-10-CM | POA: Diagnosis not present

## 2019-09-19 DIAGNOSIS — E119 Type 2 diabetes mellitus without complications: Secondary | ICD-10-CM | POA: Diagnosis not present

## 2019-09-19 DIAGNOSIS — R1310 Dysphagia, unspecified: Secondary | ICD-10-CM | POA: Diagnosis not present

## 2019-09-19 DIAGNOSIS — Z85030 Personal history of malignant carcinoid tumor of large intestine: Secondary | ICD-10-CM | POA: Diagnosis not present

## 2019-09-19 DIAGNOSIS — R21 Rash and other nonspecific skin eruption: Secondary | ICD-10-CM | POA: Diagnosis not present

## 2019-09-20 DIAGNOSIS — R21 Rash and other nonspecific skin eruption: Secondary | ICD-10-CM | POA: Diagnosis not present

## 2019-09-20 DIAGNOSIS — R32 Unspecified urinary incontinence: Secondary | ICD-10-CM | POA: Diagnosis not present

## 2019-09-20 DIAGNOSIS — Z85030 Personal history of malignant carcinoid tumor of large intestine: Secondary | ICD-10-CM | POA: Diagnosis not present

## 2019-09-20 DIAGNOSIS — E119 Type 2 diabetes mellitus without complications: Secondary | ICD-10-CM | POA: Diagnosis not present

## 2019-09-20 DIAGNOSIS — M6281 Muscle weakness (generalized): Secondary | ICD-10-CM | POA: Diagnosis not present

## 2019-09-20 DIAGNOSIS — F0280 Dementia in other diseases classified elsewhere without behavioral disturbance: Secondary | ICD-10-CM | POA: Diagnosis not present

## 2019-09-20 DIAGNOSIS — R1310 Dysphagia, unspecified: Secondary | ICD-10-CM | POA: Diagnosis not present

## 2019-09-20 DIAGNOSIS — G309 Alzheimer's disease, unspecified: Secondary | ICD-10-CM | POA: Diagnosis not present

## 2019-09-20 DIAGNOSIS — Z7982 Long term (current) use of aspirin: Secondary | ICD-10-CM | POA: Diagnosis not present

## 2019-09-24 DIAGNOSIS — R32 Unspecified urinary incontinence: Secondary | ICD-10-CM | POA: Diagnosis not present

## 2019-09-24 DIAGNOSIS — F0280 Dementia in other diseases classified elsewhere without behavioral disturbance: Secondary | ICD-10-CM | POA: Diagnosis not present

## 2019-09-24 DIAGNOSIS — R21 Rash and other nonspecific skin eruption: Secondary | ICD-10-CM | POA: Diagnosis not present

## 2019-09-24 DIAGNOSIS — Z85030 Personal history of malignant carcinoid tumor of large intestine: Secondary | ICD-10-CM | POA: Diagnosis not present

## 2019-09-24 DIAGNOSIS — G309 Alzheimer's disease, unspecified: Secondary | ICD-10-CM | POA: Diagnosis not present

## 2019-09-24 DIAGNOSIS — M6281 Muscle weakness (generalized): Secondary | ICD-10-CM | POA: Diagnosis not present

## 2019-09-24 DIAGNOSIS — E119 Type 2 diabetes mellitus without complications: Secondary | ICD-10-CM | POA: Diagnosis not present

## 2019-09-24 DIAGNOSIS — R1310 Dysphagia, unspecified: Secondary | ICD-10-CM | POA: Diagnosis not present

## 2019-09-24 DIAGNOSIS — Z7982 Long term (current) use of aspirin: Secondary | ICD-10-CM | POA: Diagnosis not present

## 2019-09-25 DIAGNOSIS — R32 Unspecified urinary incontinence: Secondary | ICD-10-CM | POA: Diagnosis not present

## 2019-09-25 DIAGNOSIS — R1310 Dysphagia, unspecified: Secondary | ICD-10-CM | POA: Diagnosis not present

## 2019-09-25 DIAGNOSIS — R21 Rash and other nonspecific skin eruption: Secondary | ICD-10-CM | POA: Diagnosis not present

## 2019-09-25 DIAGNOSIS — F0280 Dementia in other diseases classified elsewhere without behavioral disturbance: Secondary | ICD-10-CM | POA: Diagnosis not present

## 2019-09-25 DIAGNOSIS — E119 Type 2 diabetes mellitus without complications: Secondary | ICD-10-CM | POA: Diagnosis not present

## 2019-09-25 DIAGNOSIS — Z85030 Personal history of malignant carcinoid tumor of large intestine: Secondary | ICD-10-CM | POA: Diagnosis not present

## 2019-09-25 DIAGNOSIS — M6281 Muscle weakness (generalized): Secondary | ICD-10-CM | POA: Diagnosis not present

## 2019-09-25 DIAGNOSIS — Z7982 Long term (current) use of aspirin: Secondary | ICD-10-CM | POA: Diagnosis not present

## 2019-09-25 DIAGNOSIS — G309 Alzheimer's disease, unspecified: Secondary | ICD-10-CM | POA: Diagnosis not present

## 2019-09-26 DIAGNOSIS — F0280 Dementia in other diseases classified elsewhere without behavioral disturbance: Secondary | ICD-10-CM | POA: Diagnosis not present

## 2019-09-26 DIAGNOSIS — R21 Rash and other nonspecific skin eruption: Secondary | ICD-10-CM | POA: Diagnosis not present

## 2019-09-26 DIAGNOSIS — Z85030 Personal history of malignant carcinoid tumor of large intestine: Secondary | ICD-10-CM | POA: Diagnosis not present

## 2019-09-26 DIAGNOSIS — M6281 Muscle weakness (generalized): Secondary | ICD-10-CM | POA: Diagnosis not present

## 2019-09-26 DIAGNOSIS — R32 Unspecified urinary incontinence: Secondary | ICD-10-CM | POA: Diagnosis not present

## 2019-09-26 DIAGNOSIS — R1310 Dysphagia, unspecified: Secondary | ICD-10-CM | POA: Diagnosis not present

## 2019-09-26 DIAGNOSIS — E119 Type 2 diabetes mellitus without complications: Secondary | ICD-10-CM | POA: Diagnosis not present

## 2019-09-26 DIAGNOSIS — G309 Alzheimer's disease, unspecified: Secondary | ICD-10-CM | POA: Diagnosis not present

## 2019-09-26 DIAGNOSIS — Z7982 Long term (current) use of aspirin: Secondary | ICD-10-CM | POA: Diagnosis not present

## 2019-09-27 DIAGNOSIS — E119 Type 2 diabetes mellitus without complications: Secondary | ICD-10-CM | POA: Diagnosis not present

## 2019-09-27 DIAGNOSIS — R1310 Dysphagia, unspecified: Secondary | ICD-10-CM | POA: Diagnosis not present

## 2019-09-27 DIAGNOSIS — Z85030 Personal history of malignant carcinoid tumor of large intestine: Secondary | ICD-10-CM | POA: Diagnosis not present

## 2019-09-27 DIAGNOSIS — M6281 Muscle weakness (generalized): Secondary | ICD-10-CM | POA: Diagnosis not present

## 2019-09-27 DIAGNOSIS — F0280 Dementia in other diseases classified elsewhere without behavioral disturbance: Secondary | ICD-10-CM | POA: Diagnosis not present

## 2019-09-27 DIAGNOSIS — Z7982 Long term (current) use of aspirin: Secondary | ICD-10-CM | POA: Diagnosis not present

## 2019-09-27 DIAGNOSIS — R21 Rash and other nonspecific skin eruption: Secondary | ICD-10-CM | POA: Diagnosis not present

## 2019-09-27 DIAGNOSIS — G309 Alzheimer's disease, unspecified: Secondary | ICD-10-CM | POA: Diagnosis not present

## 2019-09-27 DIAGNOSIS — R32 Unspecified urinary incontinence: Secondary | ICD-10-CM | POA: Diagnosis not present

## 2019-10-01 DIAGNOSIS — Z85030 Personal history of malignant carcinoid tumor of large intestine: Secondary | ICD-10-CM | POA: Diagnosis not present

## 2019-10-01 DIAGNOSIS — M6281 Muscle weakness (generalized): Secondary | ICD-10-CM | POA: Diagnosis not present

## 2019-10-01 DIAGNOSIS — F0280 Dementia in other diseases classified elsewhere without behavioral disturbance: Secondary | ICD-10-CM | POA: Diagnosis not present

## 2019-10-01 DIAGNOSIS — G309 Alzheimer's disease, unspecified: Secondary | ICD-10-CM | POA: Diagnosis not present

## 2019-10-01 DIAGNOSIS — Z7982 Long term (current) use of aspirin: Secondary | ICD-10-CM | POA: Diagnosis not present

## 2019-10-01 DIAGNOSIS — R32 Unspecified urinary incontinence: Secondary | ICD-10-CM | POA: Diagnosis not present

## 2019-10-01 DIAGNOSIS — R1312 Dysphagia, oropharyngeal phase: Secondary | ICD-10-CM | POA: Diagnosis not present

## 2019-10-01 DIAGNOSIS — E119 Type 2 diabetes mellitus without complications: Secondary | ICD-10-CM | POA: Diagnosis not present

## 2019-10-03 DIAGNOSIS — M6281 Muscle weakness (generalized): Secondary | ICD-10-CM | POA: Diagnosis not present

## 2019-10-03 DIAGNOSIS — F0280 Dementia in other diseases classified elsewhere without behavioral disturbance: Secondary | ICD-10-CM | POA: Diagnosis not present

## 2019-10-03 DIAGNOSIS — Z7982 Long term (current) use of aspirin: Secondary | ICD-10-CM | POA: Diagnosis not present

## 2019-10-03 DIAGNOSIS — R1312 Dysphagia, oropharyngeal phase: Secondary | ICD-10-CM | POA: Diagnosis not present

## 2019-10-03 DIAGNOSIS — E119 Type 2 diabetes mellitus without complications: Secondary | ICD-10-CM | POA: Diagnosis not present

## 2019-10-03 DIAGNOSIS — G309 Alzheimer's disease, unspecified: Secondary | ICD-10-CM | POA: Diagnosis not present

## 2019-10-03 DIAGNOSIS — Z85030 Personal history of malignant carcinoid tumor of large intestine: Secondary | ICD-10-CM | POA: Diagnosis not present

## 2019-10-03 DIAGNOSIS — R32 Unspecified urinary incontinence: Secondary | ICD-10-CM | POA: Diagnosis not present

## 2019-10-04 DIAGNOSIS — Z85030 Personal history of malignant carcinoid tumor of large intestine: Secondary | ICD-10-CM | POA: Diagnosis not present

## 2019-10-04 DIAGNOSIS — F0280 Dementia in other diseases classified elsewhere without behavioral disturbance: Secondary | ICD-10-CM | POA: Diagnosis not present

## 2019-10-04 DIAGNOSIS — E119 Type 2 diabetes mellitus without complications: Secondary | ICD-10-CM | POA: Diagnosis not present

## 2019-10-04 DIAGNOSIS — G309 Alzheimer's disease, unspecified: Secondary | ICD-10-CM | POA: Diagnosis not present

## 2019-10-04 DIAGNOSIS — Z7982 Long term (current) use of aspirin: Secondary | ICD-10-CM | POA: Diagnosis not present

## 2019-10-04 DIAGNOSIS — R1312 Dysphagia, oropharyngeal phase: Secondary | ICD-10-CM | POA: Diagnosis not present

## 2019-10-04 DIAGNOSIS — R32 Unspecified urinary incontinence: Secondary | ICD-10-CM | POA: Diagnosis not present

## 2019-10-04 DIAGNOSIS — M6281 Muscle weakness (generalized): Secondary | ICD-10-CM | POA: Diagnosis not present

## 2019-10-05 DIAGNOSIS — E876 Hypokalemia: Secondary | ICD-10-CM | POA: Diagnosis not present

## 2019-10-05 DIAGNOSIS — R600 Localized edema: Secondary | ICD-10-CM | POA: Diagnosis not present

## 2019-10-05 DIAGNOSIS — I10 Essential (primary) hypertension: Secondary | ICD-10-CM | POA: Diagnosis not present

## 2019-10-08 DIAGNOSIS — M6281 Muscle weakness (generalized): Secondary | ICD-10-CM | POA: Diagnosis not present

## 2019-10-08 DIAGNOSIS — R1312 Dysphagia, oropharyngeal phase: Secondary | ICD-10-CM | POA: Diagnosis not present

## 2019-10-08 DIAGNOSIS — Z85030 Personal history of malignant carcinoid tumor of large intestine: Secondary | ICD-10-CM | POA: Diagnosis not present

## 2019-10-08 DIAGNOSIS — Z7982 Long term (current) use of aspirin: Secondary | ICD-10-CM | POA: Diagnosis not present

## 2019-10-08 DIAGNOSIS — R32 Unspecified urinary incontinence: Secondary | ICD-10-CM | POA: Diagnosis not present

## 2019-10-08 DIAGNOSIS — G309 Alzheimer's disease, unspecified: Secondary | ICD-10-CM | POA: Diagnosis not present

## 2019-10-08 DIAGNOSIS — F0280 Dementia in other diseases classified elsewhere without behavioral disturbance: Secondary | ICD-10-CM | POA: Diagnosis not present

## 2019-10-08 DIAGNOSIS — E119 Type 2 diabetes mellitus without complications: Secondary | ICD-10-CM | POA: Diagnosis not present

## 2019-10-10 DIAGNOSIS — G309 Alzheimer's disease, unspecified: Secondary | ICD-10-CM | POA: Diagnosis not present

## 2019-10-10 DIAGNOSIS — F0280 Dementia in other diseases classified elsewhere without behavioral disturbance: Secondary | ICD-10-CM | POA: Diagnosis not present

## 2019-10-10 DIAGNOSIS — E119 Type 2 diabetes mellitus without complications: Secondary | ICD-10-CM | POA: Diagnosis not present

## 2019-10-10 DIAGNOSIS — R32 Unspecified urinary incontinence: Secondary | ICD-10-CM | POA: Diagnosis not present

## 2019-10-10 DIAGNOSIS — Z85030 Personal history of malignant carcinoid tumor of large intestine: Secondary | ICD-10-CM | POA: Diagnosis not present

## 2019-10-10 DIAGNOSIS — R1312 Dysphagia, oropharyngeal phase: Secondary | ICD-10-CM | POA: Diagnosis not present

## 2019-10-10 DIAGNOSIS — M6281 Muscle weakness (generalized): Secondary | ICD-10-CM | POA: Diagnosis not present

## 2019-10-10 DIAGNOSIS — Z7982 Long term (current) use of aspirin: Secondary | ICD-10-CM | POA: Diagnosis not present

## 2019-10-12 DIAGNOSIS — M6281 Muscle weakness (generalized): Secondary | ICD-10-CM | POA: Diagnosis not present

## 2019-10-12 DIAGNOSIS — Z7982 Long term (current) use of aspirin: Secondary | ICD-10-CM | POA: Diagnosis not present

## 2019-10-12 DIAGNOSIS — G309 Alzheimer's disease, unspecified: Secondary | ICD-10-CM | POA: Diagnosis not present

## 2019-10-12 DIAGNOSIS — R32 Unspecified urinary incontinence: Secondary | ICD-10-CM | POA: Diagnosis not present

## 2019-10-12 DIAGNOSIS — R1312 Dysphagia, oropharyngeal phase: Secondary | ICD-10-CM | POA: Diagnosis not present

## 2019-10-12 DIAGNOSIS — E119 Type 2 diabetes mellitus without complications: Secondary | ICD-10-CM | POA: Diagnosis not present

## 2019-10-12 DIAGNOSIS — F0280 Dementia in other diseases classified elsewhere without behavioral disturbance: Secondary | ICD-10-CM | POA: Diagnosis not present

## 2019-10-12 DIAGNOSIS — Z85030 Personal history of malignant carcinoid tumor of large intestine: Secondary | ICD-10-CM | POA: Diagnosis not present

## 2019-10-15 DIAGNOSIS — Z7982 Long term (current) use of aspirin: Secondary | ICD-10-CM | POA: Diagnosis not present

## 2019-10-15 DIAGNOSIS — Z85030 Personal history of malignant carcinoid tumor of large intestine: Secondary | ICD-10-CM | POA: Diagnosis not present

## 2019-10-15 DIAGNOSIS — R32 Unspecified urinary incontinence: Secondary | ICD-10-CM | POA: Diagnosis not present

## 2019-10-15 DIAGNOSIS — R1312 Dysphagia, oropharyngeal phase: Secondary | ICD-10-CM | POA: Diagnosis not present

## 2019-10-15 DIAGNOSIS — F0280 Dementia in other diseases classified elsewhere without behavioral disturbance: Secondary | ICD-10-CM | POA: Diagnosis not present

## 2019-10-15 DIAGNOSIS — M6281 Muscle weakness (generalized): Secondary | ICD-10-CM | POA: Diagnosis not present

## 2019-10-15 DIAGNOSIS — E119 Type 2 diabetes mellitus without complications: Secondary | ICD-10-CM | POA: Diagnosis not present

## 2019-10-15 DIAGNOSIS — G309 Alzheimer's disease, unspecified: Secondary | ICD-10-CM | POA: Diagnosis not present

## 2019-10-16 DIAGNOSIS — F0281 Dementia in other diseases classified elsewhere with behavioral disturbance: Secondary | ICD-10-CM | POA: Diagnosis not present

## 2019-10-16 DIAGNOSIS — F064 Anxiety disorder due to known physiological condition: Secondary | ICD-10-CM | POA: Diagnosis not present

## 2019-10-16 DIAGNOSIS — F5105 Insomnia due to other mental disorder: Secondary | ICD-10-CM | POA: Diagnosis not present

## 2019-10-16 DIAGNOSIS — G301 Alzheimer's disease with late onset: Secondary | ICD-10-CM | POA: Diagnosis not present

## 2019-10-18 DIAGNOSIS — Z85030 Personal history of malignant carcinoid tumor of large intestine: Secondary | ICD-10-CM | POA: Diagnosis not present

## 2019-10-18 DIAGNOSIS — F0280 Dementia in other diseases classified elsewhere without behavioral disturbance: Secondary | ICD-10-CM | POA: Diagnosis not present

## 2019-10-18 DIAGNOSIS — E119 Type 2 diabetes mellitus without complications: Secondary | ICD-10-CM | POA: Diagnosis not present

## 2019-10-18 DIAGNOSIS — Z7982 Long term (current) use of aspirin: Secondary | ICD-10-CM | POA: Diagnosis not present

## 2019-10-18 DIAGNOSIS — G309 Alzheimer's disease, unspecified: Secondary | ICD-10-CM | POA: Diagnosis not present

## 2019-10-18 DIAGNOSIS — M6281 Muscle weakness (generalized): Secondary | ICD-10-CM | POA: Diagnosis not present

## 2019-10-18 DIAGNOSIS — R1312 Dysphagia, oropharyngeal phase: Secondary | ICD-10-CM | POA: Diagnosis not present

## 2019-10-18 DIAGNOSIS — R32 Unspecified urinary incontinence: Secondary | ICD-10-CM | POA: Diagnosis not present

## 2019-10-19 DIAGNOSIS — F0280 Dementia in other diseases classified elsewhere without behavioral disturbance: Secondary | ICD-10-CM | POA: Diagnosis not present

## 2019-10-19 DIAGNOSIS — R1312 Dysphagia, oropharyngeal phase: Secondary | ICD-10-CM | POA: Diagnosis not present

## 2019-10-19 DIAGNOSIS — G309 Alzheimer's disease, unspecified: Secondary | ICD-10-CM | POA: Diagnosis not present

## 2019-10-19 DIAGNOSIS — R32 Unspecified urinary incontinence: Secondary | ICD-10-CM | POA: Diagnosis not present

## 2019-10-19 DIAGNOSIS — Z7982 Long term (current) use of aspirin: Secondary | ICD-10-CM | POA: Diagnosis not present

## 2019-10-19 DIAGNOSIS — Z85030 Personal history of malignant carcinoid tumor of large intestine: Secondary | ICD-10-CM | POA: Diagnosis not present

## 2019-10-19 DIAGNOSIS — M6281 Muscle weakness (generalized): Secondary | ICD-10-CM | POA: Diagnosis not present

## 2019-10-19 DIAGNOSIS — E119 Type 2 diabetes mellitus without complications: Secondary | ICD-10-CM | POA: Diagnosis not present

## 2019-10-22 DIAGNOSIS — F0280 Dementia in other diseases classified elsewhere without behavioral disturbance: Secondary | ICD-10-CM | POA: Diagnosis not present

## 2019-10-22 DIAGNOSIS — Z85030 Personal history of malignant carcinoid tumor of large intestine: Secondary | ICD-10-CM | POA: Diagnosis not present

## 2019-10-22 DIAGNOSIS — Z7982 Long term (current) use of aspirin: Secondary | ICD-10-CM | POA: Diagnosis not present

## 2019-10-22 DIAGNOSIS — R32 Unspecified urinary incontinence: Secondary | ICD-10-CM | POA: Diagnosis not present

## 2019-10-22 DIAGNOSIS — E119 Type 2 diabetes mellitus without complications: Secondary | ICD-10-CM | POA: Diagnosis not present

## 2019-10-22 DIAGNOSIS — M6281 Muscle weakness (generalized): Secondary | ICD-10-CM | POA: Diagnosis not present

## 2019-10-22 DIAGNOSIS — G309 Alzheimer's disease, unspecified: Secondary | ICD-10-CM | POA: Diagnosis not present

## 2019-10-22 DIAGNOSIS — R1312 Dysphagia, oropharyngeal phase: Secondary | ICD-10-CM | POA: Diagnosis not present

## 2019-10-23 DIAGNOSIS — E119 Type 2 diabetes mellitus without complications: Secondary | ICD-10-CM | POA: Diagnosis not present

## 2019-10-23 DIAGNOSIS — Z7982 Long term (current) use of aspirin: Secondary | ICD-10-CM | POA: Diagnosis not present

## 2019-10-23 DIAGNOSIS — R1312 Dysphagia, oropharyngeal phase: Secondary | ICD-10-CM | POA: Diagnosis not present

## 2019-10-23 DIAGNOSIS — F0280 Dementia in other diseases classified elsewhere without behavioral disturbance: Secondary | ICD-10-CM | POA: Diagnosis not present

## 2019-10-23 DIAGNOSIS — Z85030 Personal history of malignant carcinoid tumor of large intestine: Secondary | ICD-10-CM | POA: Diagnosis not present

## 2019-10-23 DIAGNOSIS — G309 Alzheimer's disease, unspecified: Secondary | ICD-10-CM | POA: Diagnosis not present

## 2019-10-23 DIAGNOSIS — R32 Unspecified urinary incontinence: Secondary | ICD-10-CM | POA: Diagnosis not present

## 2019-10-23 DIAGNOSIS — M6281 Muscle weakness (generalized): Secondary | ICD-10-CM | POA: Diagnosis not present

## 2019-10-24 DIAGNOSIS — E119 Type 2 diabetes mellitus without complications: Secondary | ICD-10-CM | POA: Diagnosis not present

## 2019-10-24 DIAGNOSIS — Z85030 Personal history of malignant carcinoid tumor of large intestine: Secondary | ICD-10-CM | POA: Diagnosis not present

## 2019-10-24 DIAGNOSIS — Z7982 Long term (current) use of aspirin: Secondary | ICD-10-CM | POA: Diagnosis not present

## 2019-10-24 DIAGNOSIS — M6281 Muscle weakness (generalized): Secondary | ICD-10-CM | POA: Diagnosis not present

## 2019-10-24 DIAGNOSIS — F0280 Dementia in other diseases classified elsewhere without behavioral disturbance: Secondary | ICD-10-CM | POA: Diagnosis not present

## 2019-10-24 DIAGNOSIS — G309 Alzheimer's disease, unspecified: Secondary | ICD-10-CM | POA: Diagnosis not present

## 2019-10-24 DIAGNOSIS — R32 Unspecified urinary incontinence: Secondary | ICD-10-CM | POA: Diagnosis not present

## 2019-10-24 DIAGNOSIS — R1312 Dysphagia, oropharyngeal phase: Secondary | ICD-10-CM | POA: Diagnosis not present

## 2019-10-26 DIAGNOSIS — F064 Anxiety disorder due to known physiological condition: Secondary | ICD-10-CM | POA: Diagnosis not present

## 2019-10-26 DIAGNOSIS — Z7982 Long term (current) use of aspirin: Secondary | ICD-10-CM | POA: Diagnosis not present

## 2019-10-26 DIAGNOSIS — Z85030 Personal history of malignant carcinoid tumor of large intestine: Secondary | ICD-10-CM | POA: Diagnosis not present

## 2019-10-26 DIAGNOSIS — G301 Alzheimer's disease with late onset: Secondary | ICD-10-CM | POA: Diagnosis not present

## 2019-10-26 DIAGNOSIS — R1312 Dysphagia, oropharyngeal phase: Secondary | ICD-10-CM | POA: Diagnosis not present

## 2019-10-26 DIAGNOSIS — G309 Alzheimer's disease, unspecified: Secondary | ICD-10-CM | POA: Diagnosis not present

## 2019-10-26 DIAGNOSIS — F5105 Insomnia due to other mental disorder: Secondary | ICD-10-CM | POA: Diagnosis not present

## 2019-10-26 DIAGNOSIS — F0280 Dementia in other diseases classified elsewhere without behavioral disturbance: Secondary | ICD-10-CM | POA: Diagnosis not present

## 2019-10-26 DIAGNOSIS — F0281 Dementia in other diseases classified elsewhere with behavioral disturbance: Secondary | ICD-10-CM | POA: Diagnosis not present

## 2019-10-26 DIAGNOSIS — M6281 Muscle weakness (generalized): Secondary | ICD-10-CM | POA: Diagnosis not present

## 2019-10-26 DIAGNOSIS — E119 Type 2 diabetes mellitus without complications: Secondary | ICD-10-CM | POA: Diagnosis not present

## 2019-10-26 DIAGNOSIS — R32 Unspecified urinary incontinence: Secondary | ICD-10-CM | POA: Diagnosis not present

## 2019-10-31 DIAGNOSIS — I10 Essential (primary) hypertension: Secondary | ICD-10-CM | POA: Diagnosis not present

## 2019-10-31 DIAGNOSIS — M545 Low back pain: Secondary | ICD-10-CM | POA: Diagnosis not present

## 2019-10-31 DIAGNOSIS — M25561 Pain in right knee: Secondary | ICD-10-CM | POA: Diagnosis not present

## 2019-10-31 DIAGNOSIS — M6281 Muscle weakness (generalized): Secondary | ICD-10-CM | POA: Diagnosis not present

## 2019-11-02 DIAGNOSIS — E1142 Type 2 diabetes mellitus with diabetic polyneuropathy: Secondary | ICD-10-CM | POA: Diagnosis not present

## 2019-11-02 DIAGNOSIS — J811 Chronic pulmonary edema: Secondary | ICD-10-CM | POA: Diagnosis not present

## 2019-11-02 DIAGNOSIS — D649 Anemia, unspecified: Secondary | ICD-10-CM | POA: Diagnosis not present

## 2019-11-02 DIAGNOSIS — E039 Hypothyroidism, unspecified: Secondary | ICD-10-CM | POA: Diagnosis not present

## 2019-11-02 DIAGNOSIS — E785 Hyperlipidemia, unspecified: Secondary | ICD-10-CM | POA: Diagnosis not present

## 2019-11-04 DIAGNOSIS — J90 Pleural effusion, not elsewhere classified: Secondary | ICD-10-CM | POA: Diagnosis not present

## 2019-11-04 DIAGNOSIS — Z03818 Encounter for observation for suspected exposure to other biological agents ruled out: Secondary | ICD-10-CM | POA: Diagnosis not present

## 2019-11-04 DIAGNOSIS — R404 Transient alteration of awareness: Secondary | ICD-10-CM | POA: Diagnosis not present

## 2019-11-04 DIAGNOSIS — R0602 Shortness of breath: Secondary | ICD-10-CM | POA: Diagnosis not present

## 2019-11-04 DIAGNOSIS — Z7401 Bed confinement status: Secondary | ICD-10-CM | POA: Diagnosis not present

## 2019-11-04 DIAGNOSIS — I10 Essential (primary) hypertension: Secondary | ICD-10-CM | POA: Diagnosis not present

## 2019-11-04 DIAGNOSIS — R251 Tremor, unspecified: Secondary | ICD-10-CM | POA: Diagnosis not present

## 2019-11-04 DIAGNOSIS — R05 Cough: Secondary | ICD-10-CM | POA: Diagnosis not present

## 2019-11-04 DIAGNOSIS — R918 Other nonspecific abnormal finding of lung field: Secondary | ICD-10-CM | POA: Diagnosis not present

## 2019-11-04 DIAGNOSIS — I63522 Cerebral infarction due to unspecified occlusion or stenosis of left anterior cerebral artery: Secondary | ICD-10-CM | POA: Diagnosis not present

## 2019-11-04 DIAGNOSIS — T17920A Food in respiratory tract, part unspecified causing asphyxiation, initial encounter: Secondary | ICD-10-CM | POA: Diagnosis not present

## 2019-11-14 DIAGNOSIS — M6281 Muscle weakness (generalized): Secondary | ICD-10-CM | POA: Diagnosis not present

## 2019-11-14 DIAGNOSIS — M545 Low back pain: Secondary | ICD-10-CM | POA: Diagnosis not present

## 2019-11-14 DIAGNOSIS — R1314 Dysphagia, pharyngoesophageal phase: Secondary | ICD-10-CM | POA: Diagnosis not present

## 2019-11-14 DIAGNOSIS — M25561 Pain in right knee: Secondary | ICD-10-CM | POA: Diagnosis not present

## 2019-12-31 DEATH — deceased
